# Patient Record
Sex: Male | Born: 1970
Health system: Southern US, Community
[De-identification: ages and names within clinical notes are randomized; demographics above are authoritative.]

## PROBLEM LIST (undated history)

## (undated) DIAGNOSIS — D869 Sarcoidosis, unspecified: Secondary | ICD-10-CM

## (undated) DIAGNOSIS — T7840XA Allergy, unspecified, initial encounter: Secondary | ICD-10-CM

## (undated) HISTORY — PX: VASECTOMY: SHX75

## (undated) HISTORY — DX: Allergy, unspecified, initial encounter: T78.40XA

## (undated) HISTORY — DX: Sarcoidosis, unspecified: D86.9

---

## 2003-09-13 ENCOUNTER — Encounter: Admission: RE | Admit: 2003-09-13 | Discharge: 2003-09-13 | Payer: Self-pay | Admitting: Family Medicine

## 2004-07-17 ENCOUNTER — Ambulatory Visit: Payer: Self-pay | Admitting: Family Medicine

## 2004-08-28 ENCOUNTER — Ambulatory Visit: Payer: Self-pay | Admitting: Family Medicine

## 2004-09-05 ENCOUNTER — Ambulatory Visit: Payer: Self-pay | Admitting: Family Medicine

## 2004-09-08 ENCOUNTER — Emergency Department (HOSPITAL_COMMUNITY): Admission: EM | Admit: 2004-09-08 | Discharge: 2004-09-08 | Payer: Self-pay | Admitting: Emergency Medicine

## 2004-09-24 ENCOUNTER — Ambulatory Visit: Payer: Self-pay | Admitting: Family Medicine

## 2005-05-29 ENCOUNTER — Ambulatory Visit: Payer: Self-pay | Admitting: Family Medicine

## 2005-06-07 ENCOUNTER — Ambulatory Visit: Payer: Self-pay | Admitting: Family Medicine

## 2005-06-07 ENCOUNTER — Encounter: Admission: RE | Admit: 2005-06-07 | Discharge: 2005-06-07 | Payer: Self-pay | Admitting: Family Medicine

## 2005-06-13 ENCOUNTER — Ambulatory Visit: Payer: Self-pay | Admitting: Family Medicine

## 2005-10-07 ENCOUNTER — Ambulatory Visit: Payer: Self-pay | Admitting: Family Medicine

## 2005-10-23 ENCOUNTER — Encounter: Admission: RE | Admit: 2005-10-23 | Discharge: 2005-10-23 | Payer: Self-pay | Admitting: Pediatrics

## 2005-11-06 ENCOUNTER — Ambulatory Visit: Payer: Self-pay | Admitting: Internal Medicine

## 2005-12-18 ENCOUNTER — Ambulatory Visit: Payer: Self-pay | Admitting: Internal Medicine

## 2006-02-03 ENCOUNTER — Ambulatory Visit: Payer: Self-pay | Admitting: Family Medicine

## 2006-02-18 ENCOUNTER — Ambulatory Visit: Payer: Self-pay | Admitting: Family Medicine

## 2006-05-14 ENCOUNTER — Ambulatory Visit: Payer: Self-pay | Admitting: Internal Medicine

## 2006-07-04 ENCOUNTER — Ambulatory Visit: Payer: Self-pay | Admitting: Family Medicine

## 2006-10-20 ENCOUNTER — Ambulatory Visit: Payer: Self-pay | Admitting: Family Medicine

## 2006-11-03 ENCOUNTER — Ambulatory Visit: Payer: Self-pay | Admitting: Family Medicine

## 2006-11-12 ENCOUNTER — Ambulatory Visit: Payer: Self-pay | Admitting: Family Medicine

## 2007-04-16 DIAGNOSIS — R059 Cough, unspecified: Secondary | ICD-10-CM | POA: Insufficient documentation

## 2007-04-16 DIAGNOSIS — R05 Cough: Secondary | ICD-10-CM

## 2007-04-21 ENCOUNTER — Ambulatory Visit: Payer: Self-pay | Admitting: Internal Medicine

## 2007-04-21 DIAGNOSIS — J018 Other acute sinusitis: Secondary | ICD-10-CM | POA: Insufficient documentation

## 2007-05-08 ENCOUNTER — Ambulatory Visit: Payer: Self-pay | Admitting: Family Medicine

## 2007-05-08 DIAGNOSIS — D869 Sarcoidosis, unspecified: Secondary | ICD-10-CM | POA: Insufficient documentation

## 2007-08-11 DIAGNOSIS — B370 Candidal stomatitis: Secondary | ICD-10-CM | POA: Insufficient documentation

## 2007-08-13 ENCOUNTER — Ambulatory Visit: Payer: Self-pay | Admitting: Family Medicine

## 2007-09-07 DIAGNOSIS — B9789 Other viral agents as the cause of diseases classified elsewhere: Secondary | ICD-10-CM | POA: Insufficient documentation

## 2007-09-11 ENCOUNTER — Ambulatory Visit: Payer: Self-pay | Admitting: Family Medicine

## 2008-01-13 ENCOUNTER — Ambulatory Visit: Payer: Self-pay | Admitting: Family Medicine

## 2008-01-13 DIAGNOSIS — J45909 Unspecified asthma, uncomplicated: Secondary | ICD-10-CM | POA: Insufficient documentation

## 2008-01-13 DIAGNOSIS — F17200 Nicotine dependence, unspecified, uncomplicated: Secondary | ICD-10-CM | POA: Insufficient documentation

## 2008-01-27 ENCOUNTER — Ambulatory Visit: Payer: Self-pay | Admitting: Family Medicine

## 2008-02-01 DIAGNOSIS — J309 Allergic rhinitis, unspecified: Secondary | ICD-10-CM | POA: Insufficient documentation

## 2008-02-01 DIAGNOSIS — J45909 Unspecified asthma, uncomplicated: Secondary | ICD-10-CM | POA: Insufficient documentation

## 2008-02-04 ENCOUNTER — Telehealth: Payer: Self-pay | Admitting: Family Medicine

## 2008-03-15 ENCOUNTER — Ambulatory Visit: Payer: Self-pay | Admitting: Family Medicine

## 2008-03-15 DIAGNOSIS — K2100 Gastro-esophageal reflux disease with esophagitis, without bleeding: Secondary | ICD-10-CM | POA: Insufficient documentation

## 2008-03-22 ENCOUNTER — Telehealth: Payer: Self-pay | Admitting: Family Medicine

## 2008-04-05 ENCOUNTER — Telehealth: Payer: Self-pay | Admitting: Family Medicine

## 2008-04-13 ENCOUNTER — Telehealth: Payer: Self-pay | Admitting: *Deleted

## 2008-06-02 ENCOUNTER — Ambulatory Visit: Payer: Self-pay | Admitting: Family Medicine

## 2008-06-20 ENCOUNTER — Telehealth: Payer: Self-pay | Admitting: Family Medicine

## 2008-11-04 ENCOUNTER — Telehealth: Payer: Self-pay | Admitting: Family Medicine

## 2008-11-08 ENCOUNTER — Telehealth: Payer: Self-pay | Admitting: Family Medicine

## 2008-12-11 DIAGNOSIS — M542 Cervicalgia: Secondary | ICD-10-CM | POA: Insufficient documentation

## 2008-12-16 ENCOUNTER — Ambulatory Visit: Payer: Self-pay | Admitting: Family Medicine

## 2008-12-16 DIAGNOSIS — Z8679 Personal history of other diseases of the circulatory system: Secondary | ICD-10-CM | POA: Insufficient documentation

## 2008-12-19 ENCOUNTER — Telehealth (INDEPENDENT_AMBULATORY_CARE_PROVIDER_SITE_OTHER): Payer: Self-pay | Admitting: *Deleted

## 2009-01-03 ENCOUNTER — Ambulatory Visit: Payer: Self-pay | Admitting: Pulmonary Disease

## 2009-01-03 DIAGNOSIS — G4733 Obstructive sleep apnea (adult) (pediatric): Secondary | ICD-10-CM | POA: Insufficient documentation

## 2009-01-20 ENCOUNTER — Encounter: Payer: Self-pay | Admitting: Pulmonary Disease

## 2009-01-20 ENCOUNTER — Ambulatory Visit (HOSPITAL_BASED_OUTPATIENT_CLINIC_OR_DEPARTMENT_OTHER): Admission: RE | Admit: 2009-01-20 | Discharge: 2009-01-20 | Payer: Self-pay | Admitting: Pulmonary Disease

## 2009-02-07 ENCOUNTER — Ambulatory Visit: Payer: Self-pay | Admitting: Pulmonary Disease

## 2009-02-08 ENCOUNTER — Telehealth (INDEPENDENT_AMBULATORY_CARE_PROVIDER_SITE_OTHER): Payer: Self-pay | Admitting: *Deleted

## 2009-02-10 ENCOUNTER — Ambulatory Visit: Payer: Self-pay | Admitting: Pulmonary Disease

## 2009-04-12 ENCOUNTER — Ambulatory Visit: Payer: Self-pay | Admitting: Pulmonary Disease

## 2009-04-17 ENCOUNTER — Telehealth: Payer: Self-pay | Admitting: Family Medicine

## 2009-04-17 DIAGNOSIS — R0989 Other specified symptoms and signs involving the circulatory and respiratory systems: Secondary | ICD-10-CM

## 2009-04-17 DIAGNOSIS — G4710 Hypersomnia, unspecified: Secondary | ICD-10-CM | POA: Insufficient documentation

## 2009-04-17 DIAGNOSIS — R0609 Other forms of dyspnea: Secondary | ICD-10-CM | POA: Insufficient documentation

## 2009-05-21 ENCOUNTER — Emergency Department (HOSPITAL_COMMUNITY): Admission: EM | Admit: 2009-05-21 | Discharge: 2009-05-21 | Payer: Self-pay | Admitting: Family Medicine

## 2009-05-22 ENCOUNTER — Telehealth: Payer: Self-pay | Admitting: Family Medicine

## 2009-06-08 ENCOUNTER — Ambulatory Visit: Payer: Self-pay | Admitting: Family Medicine

## 2009-06-08 DIAGNOSIS — M25539 Pain in unspecified wrist: Secondary | ICD-10-CM | POA: Insufficient documentation

## 2009-09-15 ENCOUNTER — Ambulatory Visit: Payer: Self-pay | Admitting: Family Medicine

## 2009-09-15 DIAGNOSIS — L0201 Cutaneous abscess of face: Secondary | ICD-10-CM | POA: Insufficient documentation

## 2009-09-15 DIAGNOSIS — L03211 Cellulitis of face: Secondary | ICD-10-CM

## 2009-09-16 ENCOUNTER — Encounter: Payer: Self-pay | Admitting: Family Medicine

## 2009-09-18 ENCOUNTER — Telehealth: Payer: Self-pay | Admitting: Family Medicine

## 2009-09-19 ENCOUNTER — Ambulatory Visit: Payer: Self-pay | Admitting: Family Medicine

## 2009-09-26 ENCOUNTER — Ambulatory Visit: Payer: Self-pay | Admitting: Family Medicine

## 2009-10-02 ENCOUNTER — Telehealth: Payer: Self-pay | Admitting: Family Medicine

## 2009-10-06 ENCOUNTER — Ambulatory Visit: Payer: Self-pay | Admitting: Family Medicine

## 2009-12-15 ENCOUNTER — Telehealth: Payer: Self-pay | Admitting: Family Medicine

## 2009-12-19 ENCOUNTER — Ambulatory Visit: Payer: Self-pay | Admitting: Family Medicine

## 2009-12-19 DIAGNOSIS — G43809 Other migraine, not intractable, without status migrainosus: Secondary | ICD-10-CM | POA: Insufficient documentation

## 2009-12-25 ENCOUNTER — Telehealth: Payer: Self-pay | Admitting: Family Medicine

## 2009-12-26 ENCOUNTER — Telehealth: Payer: Self-pay | Admitting: Family Medicine

## 2009-12-27 ENCOUNTER — Ambulatory Visit: Payer: Self-pay | Admitting: Cardiovascular Disease

## 2010-01-03 ENCOUNTER — Telehealth: Payer: Self-pay | Admitting: Family Medicine

## 2010-01-15 ENCOUNTER — Telehealth (INDEPENDENT_AMBULATORY_CARE_PROVIDER_SITE_OTHER): Payer: Self-pay | Admitting: *Deleted

## 2010-03-15 ENCOUNTER — Telehealth: Payer: Self-pay | Admitting: Family Medicine

## 2010-03-15 ENCOUNTER — Encounter: Payer: Self-pay | Admitting: Family Medicine

## 2010-04-18 ENCOUNTER — Telehealth: Payer: Self-pay | Admitting: Family Medicine

## 2010-08-08 ENCOUNTER — Ambulatory Visit: Payer: Self-pay | Admitting: Pulmonary Disease

## 2010-09-07 ENCOUNTER — Ambulatory Visit
Admission: RE | Admit: 2010-09-07 | Discharge: 2010-09-07 | Payer: Self-pay | Source: Home / Self Care | Attending: Family Medicine | Admitting: Family Medicine

## 2010-10-02 NOTE — Assessment & Plan Note (Signed)
Summary: in grown hair-thinks it is ? mrsa/ccm   Vital Signs:  Patient profile:   40 year old male Weight:      192 pounds Temp:     98.5 degrees F BP sitting:   120 / 72  Vitals Entered By: Lynann Beaver CMA (September 15, 2009 2:29 PM) CC: concerned about ingrown hair on face that is infected Is Patient Diabetic? No Pain Assessment Patient in pain? no        Primary Care Provider:  Tawanna Cooler  CC:  concerned about ingrown hair on face that is infected.  History of Present Illness: Kevin Mcgee is a 40 year old, married male, nonsmoker, who comes in today for a boil under his chin.  A couple days ago.  He had an ingrown hair that turned into a boil.  Two of his family members have had MRSA in the past 3 months.  Most recently his wife one month ago.  Current Medications (verified): 1)  Mvi 2)  Flonase 50 Mcg/act  Susp (Fluticasone Propionate) .... One Shot To Each Nostril Two Times A Day 3)  Kls Aller-Tec 10 Mg Tabs (Cetirizine Hcl) .... Once Daily  Allergies (verified): No Known Drug Allergies  Past History:  Past medical, surgical, family and social histories (including risk factors) reviewed, and no changes noted (except as noted below).  Past Medical History: Reviewed history from 01/27/2008 and no changes required. sarcoidosis Allergic rhinitis Asthma  Family History: Reviewed history from 08/13/2007 and no changes required. Family History Hypertension  Social History: Reviewed history from 12/16/2008 and no changes required.  Occupation: Airline pilot Married Former Smoker Alcohol use-no Drug use-no Regular exercise-yes Current Smoker  Review of Systems      See HPI  Physical Exam  General:  Well-developed,well-nourished,in no acute distress; alert,appropriate and cooperative throughout examination Skin:  boil under her chin.  No obvious pus culture taken anyone.  He   Problems:  Medical Problems Added: 1)  Dx of Cellulitis, Face   (ICD-682.0)  Impression & Recommendations:  Problem # 1:  CELLULITIS, FACE (ICD-682.0) Assessment New  His updated medication list for this problem includes:    Keflex 500 Mg Caps (Cephalexin) .Marland Kitchen... 2 by mouth two times a day  Orders: Prescription Created Electronically [phone]) T-Culture, Wound ([id number])  Complete Medication List: 1)  Mvi  2)  Flonase 50 Mcg/act Susp (Fluticasone propionate) .... One shot to each nostril two times a day 3)  Kls Aller-tec 10 Mg Tabs (Cetirizine hcl) .... Once daily 4)  Keflex 500 Mg Caps (Cephalexin) .... 2 by mouth two times a day  Patient Instructions: 1)  apply warm soaks 15 minutes 3 or 4 times daily.  Begin Keflex two tabs b.i.d.Marland Kitchen  I will call you next week when I get your culture report back Prescriptions: KEFLEX 500 MG CAPS (CEPHALEXIN) 2 by mouth two times a day  #50 x 1   Entered and Authorized by:   [name] MD   Signed by:   [name] MD on 09/15/2009   Method used:   Electronically to        [facility] (retail)       [address]       [place], [place]  [address]       Ph: [phone]       Fax: [phone]   RxID:   [id number]

## 2010-10-02 NOTE — Progress Notes (Signed)
Summary: antibiotic  Phone Note Call from Patient   Caller: Patient Call For: [name] MD Summary of Call: Pt is having a sinus infection, headache, and congestion.  Would like an antibiotic as he cannot get away from the office until next week. [phone]  [facility]) Initial call taken by: Lynann Beaver CMA,  December 15, 2009 1:19 PM  Follow-up for Phone Call        please call we cannot call in any medication. Follow-up by: [name] MD,  December 15, 2009 1:43 PM  Additional Follow-up for Phone Call Additional follow up Details #1::        left message for patient to return our call Additional Follow-up by: Kern Reap CMA Duncan Dull),  December 15, 2009 2:13 PM    Additional Follow-up for Phone Call Additional follow up Details #2::    [phone] patient is aware Follow-up by: Kern Reap CMA Duncan Dull),  December 15, 2009 4:21 PM

## 2010-10-02 NOTE — Assessment & Plan Note (Signed)
Summary: 1 wk rov/njr   Vital Signs:  Patient profile:   40 year old male Weight:      193 pounds Temp:     193 degrees F BP sitting:   108 / 80  (left arm) Cuff size:   regular  Vitals Entered By: Kern Reap CMA Duncan Dull) (September 26, 2009 1:58 PM)  Reason for Visit follow up office visit  Primary Care Provider:  Tawanna Cooler   History of Present Illness: Kevin Mcgee is a 40 year old male comes back today for follow-up of MRSA.  The lesions have healed however, the one under his chin.  His left a lump.    Allergies: No Known Drug Allergies  Review of Systems      See HPI  Physical Exam  General:  Well-developed,well-nourished,in no acute distress; alert,appropriate and cooperative throughout examination Skin:  there is a marble-sized lesion in the under the angle of the right mandible where the previous abscess was scarred down hard, firm, not draining   Impression & Recommendations:  Problem # 1:  CELLULITIS, FACE (ICD-682.0) Assessment Improved  His updated medication list for this problem includes:    Keflex 500 Mg Caps (Cephalexin) .Marland Kitchen... 2 by mouth two times a day    Doxycycline Hyclate 100 Mg Caps (Doxycycline hyclate) .Marland Kitchen... Take 1 tablet by mouth two times a day    Septra Ds 800-160 Mg Tabs (Sulfamethoxazole-trimethoprim) .Marland Kitchen... Take 1 tablet by mouth two times a day  Complete Medication List: 1)  Mvi  2)  Flonase 50 Mcg/act Susp (Fluticasone propionate) .... One shot to each nostril two times a day 3)  Kls Aller-tec 10 Mg Tabs (Cetirizine hcl) .... Once daily 4)  Keflex 500 Mg Caps (Cephalexin) .... 2 by mouth two times a day 5)  Doxycycline Hyclate 100 Mg Caps (Doxycycline hyclate) .... Take 1 tablet by mouth two times a day 6)  Septra Ds 800-160 Mg Tabs (Sulfamethoxazole-trimethoprim) .... Take 1 tablet by mouth two times a day  Patient Instructions: 1)  take the doxycycline and Septra, one of each twice a day for another week for a total two-week course of  therapy. 2)  If you notice any recurrence began the medication immediately and call us. 3)  Also, if the mass is bothersome, but is now that we will get his to see a plastic surgeon to have that excised Prescriptions: FLONASE 50 MCG/ACT  SUSP (FLUTICASONE PROPIONATE) one shot to each nostril two times a day  #1 x 3   Entered by:   Kern Reap CMA (AAMA)   Authorized by:   [name] MD   Signed by:   Kern Reap CMA (AAMA) on 09/26/2009   Method used:   Faxed to ...       [facility] (retail)       [address]       [place], [place]  [address]       Ph: [phone]       Fax: [phone]   RxID:   [id number]

## 2010-10-02 NOTE — Progress Notes (Signed)
Summary: cpap  Phone Note Call from Patient   Caller: Patient Call For: clance Summary of Call: pt has questions re: purchase of cpap. [phone] Initial call taken by: Tivis Ringer, CNA,  Jan 15, 2010 3:10 PM  Follow-up for Phone Call        pt wanted advise on what kind of cpap to purchase on line i advised him if he had not seen his dr in 66yr he needs to see the dr and let the dr help him decide what to do  Follow-up by: Oneita Jolly,  Jan 15, 2010 4:43 PM

## 2010-10-02 NOTE — Progress Notes (Signed)
  Phone Note Outgoing Call   Summary of Call: culture report grew out MRSA......... stop the Keflex begin doxycycline and Septra, one of each b.i.d. follow-up Thursday morning 815 Initial call taken by: [name] MD,  September 18, 2009 1:40 PM

## 2010-10-02 NOTE — Progress Notes (Signed)
Summary: records to [facility]  Phone Note Call from Patient Call back at Home Phone [phone]   Caller: Patient Summary of Call: Headache clinic has not received records & says I cannot make appointment there until they receive records.  Dr. Tawanna Cooler told me to go there.   Initial call taken by: [name], RN,  Jan 03, 2010 2:24 PM  Follow-up for Phone Call        please send records to headache clinic Follow-up by: [name] MD,  Jan 04, 2010 7:23 AM  Additional Follow-up for Phone Call Additional follow up Details #1::        faxed Additional Follow-up by: Kern Reap CMA Duncan Dull),  Jan 04, 2010 10:46 AM

## 2010-10-02 NOTE — Assessment & Plan Note (Signed)
Summary: lesion on buttocks/dm   Vital Signs:  Patient profile:   40 year old male Weight:      195 pounds Temp:     98.6 degrees F oral BP sitting:   110 / 80  (left arm) Cuff size:   regular  Vitals Entered By: Kern Reap CMA Duncan Dull) (September 19, 2009 4:29 PM)  Reason for Visit lession on buttock  Primary Care Provider:  Tawanna Cooler   History of Present Illness: Kevin Mcgee is a 40 year old male, who comes back today for follow-up of MRSA.  He was seen earlier in the week with a lesion under his chin.  Culture was positive.  He was started on doxy b.i.d. and Septra b.i.d.  He now has a new bump around his rectum.  The lesion on his chin is healing and draining  Allergies: No Known Drug Allergies  Past History:  Past medical, surgical, family and social histories (including risk factors) reviewed for relevance to current acute and chronic problems.  Past Medical History: Reviewed history from 01/27/2008 and no changes required. sarcoidosis Allergic rhinitis Asthma  Family History: Reviewed history from 08/13/2007 and no changes required. Family History Hypertension  Social History: Reviewed history from 12/16/2008 and no changes required.  Occupation: Airline pilot Married Former Smoker Alcohol use-no Drug use-no Regular exercise-yes Current Smoker  Review of Systems      See HPI  Physical Exam  General:  Well-developed,well-nourished,in no acute distress; alert,appropriate and cooperative throughout examination Skin:  the lesion under his chin is straining, and healing.  There is a BB-sized bump around the rectum at the 12 o'clock position.   Impression & Recommendations:  Problem # 1:  CELLULITIS, FACE (ICD-682.0) Assessment Improved  His updated medication list for this problem includes:    Keflex 500 Mg Caps (Cephalexin) .Marland Kitchen... 2 by mouth two times a day    Doxycycline Hyclate 100 Mg Caps (Doxycycline hyclate) .Marland Kitchen... Take 1 tablet by mouth two times a day  Septra Ds 800-160 Mg Tabs (Sulfamethoxazole-trimethoprim) .Marland Kitchen... Take 1 tablet by mouth two times a day  Orders: Prescription Created Electronically [phone])  Complete Medication List: 1)  Mvi  2)  Flonase 50 Mcg/act Susp (Fluticasone propionate) .... One shot to each nostril two times a day 3)  Kls Aller-tec 10 Mg Tabs (Cetirizine hcl) .... Once daily 4)  Keflex 500 Mg Caps (Cephalexin) .... 2 by mouth two times a day 5)  Doxycycline Hyclate 100 Mg Caps (Doxycycline hyclate) .... Take 1 tablet by mouth two times a day 6)  Septra Ds 800-160 Mg Tabs (Sulfamethoxazole-trimethoprim) .... Take 1 tablet by mouth two times a day  Patient Instructions: 1)  continue the medication, and soaking.  Return in one week for follow Prescriptions: FLONASE 50 MCG/ACT  SUSP (FLUTICASONE PROPIONATE) one shot to each nostril two times a day  #1 x 3   Entered by:   Kern Reap CMA (AAMA)   Authorized by:   [name] MD   Signed by:   Kern Reap CMA (AAMA) on 09/19/2009   Method used:   Electronically to        [facility] (retail)       [address]       [place], [place]  [address]       Ph: [phone]       Fax: [phone]   RxID:   [id number]

## 2010-10-02 NOTE — Assessment & Plan Note (Signed)
Summary: pt concerned about pneumonia/dm   Vital Signs:  Patient profile:   40 year old male Weight:      197 pounds Temp:     98.6 degrees F BP sitting:   110 / 80  (left arm) Cuff size:   regular  Vitals Entered By: Kern Reap CMA Duncan Dull) (October 06, 2009 2:30 PM)  Reason for Visit headache, cough, head congestion  Primary Care Provider:  Tawanna Cooler   History of Present Illness: Kevin Mcgee is a 40 year old, married male, nonsmoker, who comes in today for evaluation of two issues.  MRSA is going to the family again.  His 40-year-old son now has it for the second time.  [name] had two lesions that we start him on doxycycline and Septra b.i.d. two weeks ago.  The lesions have mostly resolved.  He did develop some nausea.  Therefore, we stopped the medicine for 48 hours restarted at one minute each daily.  The lesion in his neck is markedly diminished.  In the past 3 or 4 days had head congestion, runny nose, nonproductive cough.  No fever.    Allergies: No Known Drug Allergies  Past History:  Past medical, surgical, family and social histories (including risk factors) reviewed, and no changes noted (except as noted below).  Past Medical History: Reviewed history from 01/27/2008 and no changes required. sarcoidosis Allergic rhinitis Asthma  Family History: Reviewed history from 08/13/2007 and no changes required. Family History Hypertension  Social History: Reviewed history from 12/16/2008 and no changes required.  Occupation: Airline pilot Married Former Smoker Alcohol use-no Drug use-no Regular exercise-yes Current Smoker  Review of Systems      See HPI  Physical Exam  General:  Well-developed,well-nourished,in no acute distress; alert,appropriate and cooperative throughout examination Head:  Normocephalic and atraumatic without obvious abnormalities. No apparent alopecia or balding. Eyes:  No corneal or conjunctival inflammation noted. EOMI. Perrla. Funduscopic exam  benign, without hemorrhages, exudates or papilledema. Vision grossly normal. Ears:  External ear exam shows no significant lesions or deformities.  Otoscopic examination reveals clear canals, tympanic membranes are intact bilaterally without bulging, retraction, inflammation or discharge. Hearing is grossly normal bilaterally. Nose:  External nasal examination shows no deformity or inflammation. Nasal mucosa are pink and moist without lesions or exudates. Mouth:  Oral mucosa and oropharynx without lesions or exudates.  Teeth in good repair. Skin:  the submandibular lesion is 75% smaller than it was a week ago   Impression & Recommendations:  Problem # 1:  CELLULITIS, FACE (ICD-682.0) Assessment Improved  His updated medication list for this problem includes:    Keflex 500 Mg Caps (Cephalexin) .Marland Kitchen... 2 by mouth two times a day    Doxycycline Hyclate 100 Mg Caps (Doxycycline hyclate) .Marland Kitchen... Take 1 tablet by mouth two times a day    Septra Ds 800-160 Mg Tabs (Sulfamethoxazole-trimethoprim) .Marland Kitchen... Take 1 tablet by mouth two times a day  Problem # 2:  VIRAL INFECTION (ICD-079.99) Assessment: Deteriorated  His updated medication list for this problem includes:    Hydromet 5-1.5 Mg/53ml Syrp (Hydrocodone-homatropine) .Marland Kitchen... 1 or 2 tsps at bedtime as needed  Complete Medication List: 1)  Mvi  2)  Flonase 50 Mcg/act Susp (Fluticasone propionate) .... One shot to each nostril two times a day 3)  Kls Aller-tec 10 Mg Tabs (Cetirizine hcl) .... Once daily 4)  Keflex 500 Mg Caps (Cephalexin) .... 2 by mouth two times a day 5)  Doxycycline Hyclate 100 Mg Caps (Doxycycline hyclate) .... Take 1 tablet by  mouth two times a day 6)  Septra Ds 800-160 Mg Tabs (Sulfamethoxazole-trimethoprim) .... Take 1 tablet by mouth two times a day 7)  Phisohex 3 % Liqd (Hexachlorophene) .... Scrub 3 x week 8)  Hydromet 5-1.5 Mg/10ml Syrp (Hydrocodone-homatropine) .Marland Kitchen.. 1 or 2 tsps at bedtime as needed  Patient  Instructions: 1)  one or 2 teaspoons of Hydromet at bedtime as needed for cough and cold. 2)  Take one doxycycline and Septra daily for two more weeks. 3)  We might consider scrubbing the whole family with pHisoHex 3 times a week Prescriptions: HYDROMET 5-1.5 MG/5ML SYRP (HYDROCODONE-HOMATROPINE) 1 or 2 tsps at bedtime as needed  #8oz x 1   Entered and Authorized by:   [name] MD   Signed by:   [name] MD on 10/06/2009   Method used:   Print then Give to Patient   RxID:   [id number] PHISOHEX 3 % LIQD (HEXACHLOROPHENE) Scrub 3 x week  #1 pint x 2   Entered and Authorized by:   [name] MD   Signed by:   [name] MD on 10/06/2009   Method used:   Print then Give to Patient   RxID:   [id number]

## 2010-10-02 NOTE — Assessment & Plan Note (Signed)
Summary: severe headaches/sinus issues/cjr   Vital Signs:  Patient profile:   40 year old male Height:      74.5 inches Weight:      200 pounds BMI:     25.43 Temp:     97.8 degrees F oral BP sitting:   120 / 80  (left arm) Cuff size:   regular  Vitals Entered By: Kern Reap CMA Duncan Dull) (December 19, 2009 11:36 AM) CC: headache Is Patient Diabetic? No Pain Assessment Patient in pain? yes     Location: head Intensity: 3 Type: dull   Primary Care Provider:  Tawanna Cooler  CC:  headache.  History of Present Illness: Kevin Mcgee is a 40 year old male, who comes in today for evaluation of chronic daily headaches for one month.  He noticed a month ago he began having bifrontal headaches.  They occur in the morning to last all day long.  They don't keep him awake at night.  He has no neurologic symptoms specifically, no vision change, hearing change, numbness, weakness, et Karie Soda.  He has had a history of migraines in the past.  He also has allergic rhinitis.  Allergies: No Known Drug Allergies  Past History:  Past medical, surgical, family and social histories (including risk factors) reviewed for relevance to current acute and chronic problems.  Past Medical History: Reviewed history from 01/27/2008 and no changes required. sarcoidosis Allergic rhinitis Asthma  Family History: Reviewed history from 08/13/2007 and no changes required. Family History Hypertension  Social History: Reviewed history from 12/16/2008 and no changes required.  Occupation: Airline pilot Married Former Smoker Alcohol use-no Drug use-no Regular exercise-yes Current Smoker  Review of Systems      See HPI  Physical Exam  General:  Well-developed,well-nourished,in no acute distress; alert,appropriate and cooperative throughout examination Head:  Normocephalic and atraumatic without obvious abnormalities. No apparent alopecia or balding. Eyes:  No corneal or conjunctival inflammation noted. EOMI. Perrla.  Funduscopic exam benign, without hemorrhages, exudates or papilledema. Vision grossly normal. Ears:  bilateral cerumen impactions removed by irrigation. Nose:  External nasal examination shows no deformity or inflammation. Nasal mucosa are pink and moist without lesions or exudates. Mouth:  Oral mucosa and oropharynx without lesions or exudates.  Teeth in good repair. Neurologic:  No cranial nerve deficits noted. Station and gait are normal. Plantar reflexes are down-going bilaterally. DTRs are symmetrical throughout. Sensory, motor and coordinative functions appear intact.   Problems:  Medical Problems Added: 1)  Dx of Cluster Headache  (ICD-346.20)  Impression & Recommendations:  Problem # 1:  CLUSTER HEADACHE (ICD-346.20) Assessment New  Orders: Prescription Created Electronically [phone])  Complete Medication List: 1)  Mvi  2)  Flonase 50 Mcg/act Susp (Fluticasone propionate) .... One shot to each nostril two times a day 3)  Kls Aller-tec 10 Mg Tabs (Cetirizine hcl) .... Once daily 4)  Doxycycline Hyclate 100 Mg Caps (Doxycycline hyclate) .... Take 1 tablet by mouth two times a day 5)  Prednisone 20 Mg Tabs (Prednisone) .... Uad  Patient Instructions: 1)  begin prednisone, take two tabs x 3 days, one x 3 days, a half x 3 days, then half a tablet Monday, Wednesday, Friday, for a two week taper.  Return p.r.n. Prescriptions: PREDNISONE 20 MG TABS (PREDNISONE) UAD  #40 x 1   Entered and Authorized by:   [name] MD   Signed by:   [name] MD on 12/19/2009   Method used:   Electronically to        [facility] [address] (retail)       [address]       [place], [place]  [address]       Ph: [phone]       Fax: [phone]   RxID:   [id number]

## 2010-10-02 NOTE — Letter (Signed)
Summary: HEADACHE REFERRAL SHEET  HEADACHE REFERRAL SHEET   Imported ByLysle Rubens 03/15/2010 16:36:51  _____________________________________________________________________  External Attachment:    Type:   Image     Comment:   External Document

## 2010-10-02 NOTE — Progress Notes (Signed)
Summary: MRSA?  Phone Note Call from Patient   Caller: Patient Call For: [name] MD Summary of Call: Pt feels that he has a recurrence of MRSA under chin.  He has Bactrim and Doxycycline at home, and is asking if he should start treatment. [phone] Initial call taken by: Lynann Beaver CMA,  April 18, 2010 9:14 AM  Follow-up for Phone Call        he can take the doxycycline twice a day, and the Septra twice a day or before starting medication to confirm diagnosis.  He can come here today to get a culture Follow-up by: [name] MD,  April 18, 2010 9:24 AM  Additional Follow-up for Phone Call Additional follow up Details #1::        patient will begin taking medication today Additional Follow-up by: Kern Reap CMA Duncan Dull),  April 18, 2010 11:30 AM

## 2010-10-02 NOTE — Progress Notes (Signed)
Summary: headace no better  Phone Note Call from Patient   Caller: Patient Call For: [name] MD Summary of Call: [phone] [facility]) Still having cluster headache and is going to taper the Prednisone.  Wants to know what to do next? Initial call taken by: Lynann Beaver CMA,  December 25, 2009 10:05 AM  Follow-up for Phone Call        set him up for a limited CT scan of the sinuses.  No contrast continue current dose of prednisone.  We will call after x-ray report Follow-up by: [name] MD,  December 25, 2009 10:11 AM  Additional Follow-up for Phone Call Additional follow up Details #1::        Orders sent to Terri. Additional Follow-up by: Lynann Beaver CMA,  December 25, 2009 10:37 AM

## 2010-10-02 NOTE — Progress Notes (Signed)
Summary: PT DECLINE OV WITH [facility]  Phone Note Call from Patient Call back at Home Phone [phone]   Caller: Patient Call For: [name] MD Summary of Call: Referral to [facility] center was iniated on 01-04-2010. Pt decline ov he will call headache wellness center if needed per referral coordinator. Initial call taken by: Heron Sabins,  March 15, 2010 4:34 PM

## 2010-10-02 NOTE — Progress Notes (Signed)
Summary: nausea & lingering headache  Phone Note Call from Patient Call back at Home Phone [phone]   Caller: vm Call For: Kevin Mcgee Summary of Call: Taking antibiotics given ov.   Feeling nausea & lingering headache since Thur.  Taking ASA & Excedrin Migraine for it.  Eating food before antibiotics but still feel nausea.  Side effect or concern?   Initial call taken by: [name], RN,  October 02, 2009 3:09 PM  Follow-up for Phone Call        these are common side effects from the medication.  Hold the medicine for 48 hours and then restart it by taking one of each daily Follow-up by: [name] MD,  October 02, 2009 5:58 PM  Additional Follow-up for Phone Call Additional follow up Details #1::        Will hold off on meds for 48 hours, then take one of each for another week, and if any questions about how he's doing, he'll call back.  Last week he was told to take the meds for another week, so when he resumes meds, he'll take them for another week. Additional Follow-up by: [name], RN,  October 03, 2009 9:21 AM

## 2010-10-02 NOTE — Assessment & Plan Note (Signed)
Summary: rov for UARS   Visit Type:  Follow-up Primary Provider/Referring Provider:  Tawanna Cooler  CC:  follow up. pt is no longer on cpap machine. pt states he was on a loner and he returned it. pt c/o not being able to sleep and feels tired all day. Marland Kitchen  History of Present Illness: the pt comes in today for f/u of his sleep issues.  He has not been seen in a year, and has a h/o UARS with loud snoring and daytime hypersomnia.  He was tried on cpap initially which did work well, but he stopped using.  He could not justify the inconvenience with the improvement in his QOL.  He comes in today where he continues to snore per his wife, and has nonrestorative sleep with daytime sleepiness.  His weight is actually down 2 pounds from last year.  Current Medications (verified): 1)  Mvi .... Once Daily 2)  Flonase 50 Mcg/act  Susp (Fluticasone Propionate) .... One Shot To Each Nostril Two Times A Day 3)  Kls Aller-Tec 10 Mg Tabs (Cetirizine Hcl) .... Once Daily 4)  Omeprazole 20 Mg Cpdr (Omeprazole) .Marland Kitchen.. 1 Every Other Day  Allergies (verified): No Known Drug Allergies  Review of Systems       The patient complains of shortness of breath with activity, productive cough, non-productive cough, chest pain, acid heartburn, headaches, nasal congestion/difficulty breathing through nose, and anxiety.  The patient denies shortness of breath at rest, coughing up blood, irregular heartbeats, indigestion, loss of appetite, weight change, abdominal pain, difficulty swallowing, sore throat, tooth/dental problems, sneezing, itching, ear ache, depression, hand/feet swelling, joint stiffness or pain, rash, change in color of mucus, and fever.    Vital Signs:  Patient profile:   40 year old male Height:      74.5 inches Weight:      203 pounds BMI:     25.81 O2 Sat:      98 % on Room air Temp:     98.4 degrees F oral Pulse rate:   100 / minute BP sitting:   128 / 84  (left arm) Cuff size:   regular  Vitals Entered  By: Carver Fila (August 08, 2010 10:08 AM)  O2 Flow:  Room air CC: follow up. pt is no longer on cpap machine. pt states he was on a loner and he returned it. pt c/o not being able to sleep, feels tired all day.  Comments meds and allergies updated Phone number updated Mindy Edward Jolly  August 08, 2010 10:09 AM    Physical Exam  General:  wd male in nad Nose:  turbinate hypertrophy, mildly deviated septum to left with narrowing but patent Mouth:  moderate elongation of soft palate, normal uvula, no tonsillar hypertrophy Extremities:  no edema or cyanosis Neurologic:  alert and oriented, moves all 4.   Impression & Recommendations:  Problem # 1:  HYPERSOMNIA (ICD-780.54) the pt has snoring and hypersomnia that is c/w the upper airway resistant syndrome.  The pt has been tried on cpap, but did not fit with his QOL.  He has continued to be symptomatic, and both his and his wife's sleep is being affected.  I have discussed the other options with him, including upper airway surgery and dental appliance.  I think the former would be best to start with, and he agrees.  Will refer to dental medicine for evaluation.  Medications Added to Medication List This Visit: 1)  Mvi  .... Once daily 2)  Omeprazole 20 Mg Cpdr (Omeprazole) .Marland Kitchen.. 1 every other day  Other Orders: Est. Patient Level III (16109) Dental Referral (Dentist)  Patient Instructions: 1)  will refer to dentistry for consideration of an oral appliance.

## 2010-10-02 NOTE — Progress Notes (Signed)
Summary: ct question  Phone Note Call from Patient   Summary of Call: patient is calling because he has spoken to someone at his insurance company.  now he would like to know if he can also have the ct to include his sinuses as well as his head. Initial call taken by: Kern Reap CMA Duncan Dull),  December 26, 2009 9:29 AM  Follow-up for Phone Call        ok Follow-up by: [name] MD,  December 26, 2009 9:33 AM  Additional Follow-up for Phone Call Additional follow up Details #1::        order updated Additional Follow-up by: Kern Reap CMA Duncan Dull),  December 26, 2009 11:12 AM

## 2010-10-02 NOTE — Progress Notes (Signed)
Summary: another call requested Dr. Karie Schwalbe  Phone Note Call from Patient Call back at [facility] Phone [phone]   Summary of Call: Re my MRSA:  Any precautions to take for family, wife who had MRSA, One child had MRSA, 2 have not had to prevent further MRSA.   Does it jump back & forth, such as holding children close to me now? Initial call taken by: [name], RN,  September 18, 2009 1:49 PM  Follow-up for Phone Call        meticulous handwashing, that's all you can do Follow-up by: [name] MD,  September 18, 2009 5:59 PM  Additional Follow-up for Phone Call Additional follow up Details #1::        Notified pt. and he needs appt today for lesion on buttocks. Additional Follow-up by: Lynann Beaver CMA,  September 19, 2009 9:08 AM

## 2010-10-04 NOTE — Assessment & Plan Note (Signed)
Summary: pt went to urgent care/?sinus inf 115pm/njr   Vital Signs:  Patient profile:   40 year old male Weight:      196 pounds Temp:     98.2 degrees F oral BP sitting:   118 / 80  (left arm) Cuff size:   regular  Vitals Entered By: Kern Reap CMA Duncan Dull) (September 07, 2010 1:26 PM) CC: follow-up visit from urgent care   Primary Care Provider:  Tawanna Cooler  CC:  follow-up visit from urgent care.  History of Present Illness: Kevin Mcgee is a 40 year old male, who comes in today for follow-up of allergic rhinitis and asthma.  He had a viral syndrome that triggered his allergic rhinitis and asthma.  Prior to the [employer].  He went to an urgent care and was given a Z-Pak, which did not really help.  He did restart the prednisone that I given him in the past and this is helped a lot.  The cough is diminished.  He stop the cough syrup, but he still has some chest congestion a lot of drainage and does some discolored sputum  Allergies: No Known Drug Allergies  Past History:  Past medical, surgical, family and social histories (including risk factors) reviewed for relevance to current acute and chronic problems.  Past Medical History: Reviewed history from 01/27/2008 and no changes required. sarcoidosis Allergic rhinitis Asthma  Family History: Reviewed history from 08/13/2007 and no changes required. Family History Hypertension  Social History: Reviewed history from 12/16/2008 and no changes required.  Occupation: Airline pilot Married Former Smoker Alcohol use-no Drug use-no Regular exercise-yes Current Smoker  Review of Systems      See HPI  Physical Exam  General:  Well-developed,well-nourished,in no acute distress; alert,appropriate and cooperative throughout examination Head:  Normocephalic and atraumatic without obvious abnormalities. No apparent alopecia or balding. Eyes:  No corneal or conjunctival inflammation noted. EOMI. Perrla. Funduscopic exam benign,  without hemorrhages, exudates or papilledema. Vision grossly normal. Ears:  External ear exam shows no significant lesions or deformities.  Otoscopic examination reveals clear canals, tympanic membranes are intact bilaterally without bulging, retraction, inflammation or discharge. Hearing is grossly normal bilaterally. Nose:  External nasal examination shows no deformity or inflammation. Nasal mucosa are pink and moist without lesions or exudates. Mouth:  Oral mucosa and oropharynx without lesions or exudates.  Teeth in good repair. Neck:  No deformities, masses, or tenderness noted. Chest Wall:  No deformities, masses, tenderness or gynecomastia noted. Lungs:  symmetrical breath sounds delayed expiratory wheezing bilaterally   Impression & Recommendations:  Problem # 1:  ASTHMA (ICD-493.90) Assessment Deteriorated  Complete Medication List: 1)  Mvi  .... Once daily 2)  Flonase 50 Mcg/act Susp (Fluticasone propionate) .... One shot to each nostril two times a day 3)  Kls Aller-tec 10 Mg Tabs (Cetirizine hcl) .... Once daily 4)  Omeprazole 20 Mg Cpdr (Omeprazole) .Marland Kitchen.. 1 every other day  Patient Instructions: 1)  take to prednisone tablets x 3 days, one x 3 days, a half x 3 days, then half a tablet Monday, Wednesday, Friday, for two week taper.  Return p.r.n.   Orders Added: 1)  Est. Patient Level IV [04540]

## 2010-10-13 ENCOUNTER — Other Ambulatory Visit: Payer: Self-pay | Admitting: Family Medicine

## 2010-10-15 ENCOUNTER — Telehealth: Payer: Self-pay | Admitting: Family Medicine

## 2010-10-15 NOTE — Telephone Encounter (Signed)
Pt called to check on status of refill of generic Flonase. Pt said req was sent in on Wed last week. Pls call in to [facility] on [address]

## 2010-10-16 MED ORDER — FLUTICASONE PROPIONATE 50 MCG/ACT NA SUSP: 2.0000 | Freq: Every day | NASAL | Status: DC

## 2010-10-29 ENCOUNTER — Encounter: Payer: Self-pay | Admitting: Family Medicine

## 2010-10-29 ENCOUNTER — Ambulatory Visit (INDEPENDENT_AMBULATORY_CARE_PROVIDER_SITE_OTHER): Payer: PRIVATE HEALTH INSURANCE | Admitting: Family Medicine

## 2010-10-29 VITALS — BP 120/80 | Temp 98.3°F | Ht 74.5 in | Wt 196.0 lb

## 2010-10-29 DIAGNOSIS — M79642 Pain in left hand: Secondary | ICD-10-CM

## 2010-10-29 DIAGNOSIS — M79609 Pain in unspecified limb: Secondary | ICD-10-CM

## 2010-10-29 NOTE — Progress Notes (Signed)
  Subjective:    Patient ID: Kevin Mcgee, male    DOB: [birth year], 40 y.o.   MRN: [id number]  HPI Kevin Mcgee Is a 40 year old, married male, nonsmoker, who comes in today for evaluation of pain in his left wrist for two months.  He said his pain in his left wrist for about two months.  He points to the base of his thumb as a source of his pain.  He is left-handed.  No history of trauma.   Review of Systems Negative   Objective:   Physical Exam Well-developed well-nourished, and in no acute distress.  Examination of both hands appears normal.  Muscle strength, sensation, skin all normal.  There is tenderness in the left thumb at the base       Assessment & Plan:  Pain left thumb.  Plan elevation, ice, splint.  Motrin, 600 b.i.d. Ortho  consult p.r.n.

## 2010-10-29 NOTE — Patient Instructions (Signed)
Motrin 600 mg twice daily with food.  A short arm splint at bedtime.  Call Dr. Laroy Apple if the symptoms persist

## 2011-01-15 NOTE — Procedures (Signed)
NAME:  Kevin Mcgee, Kevin Mcgee NO.:  [id number]   MEDICAL RECORD NO.:  [id number]          PATIENT TYPE:  OUT   LOCATION:  SLEEP CENTER                 FACILITY:  [facility]   PHYSICIAN:  Barbaraann Share, MD,FCCPDATE OF BIRTH:  10-05-70   DATE OF STUDY:  01/20/2009                            NOCTURNAL POLYSOMNOGRAM   REFERRING PHYSICIAN:  Barbaraann Share, MD,FCCP   REFERRING PHYSICIAN:  Barbaraann Share, MD, FCCP   INDICATION FOR THE STUDY:  Hypersomnia with sleep apnea.   EPWORTH SCORE:  Epworth score is 16.   SLEEP ARCHITECTURE:  The patient had a total sleep time of 388 minutes  with no slow-wave sleep and only 56 minutes of REM.  Sleep onset latency  was normal at 0.5 minutes, and REM onset was normal at 78 minutes.  Sleep efficiency was excellent at 96%.   RESPIRATORY DATA:  The patient was found to have no obstructive apneas  and only 1 obstructive hypopnea, giving him an apnea-hypopnea index of  0.2 events per hour.  There was loud snoring noted throughout.  The  patient was also noted to have very fragmented REM when it did occur,  and also 128 spontaneous arousals.  Close examination of his REM showed  a frequent decrease in air flow, but these did not meet criteria for  hypopneas.   OXYGEN DATA:  There was O2 desaturation as low as 87% transiently.   CARDIAC DATA:  No clinically significant arrhythmias were seen.   MOVEMENT/PARASOMNIA:  No significant leg jerks or abnormal behavior  seen.   IMPRESSION/RECOMMENDATIONS:  Small numbers of obstructive events, which  do not meet the apnea-hypopnea index criteria for the obstructive sleep  apnea syndrome.  However, the patient had extremely fragmented REM with  frequent spontaneous arousals.  Closer examination of REM did show  decrease in air flow on a frequent  basis, but did not meet strict criteria for hypopneas.  His NPSG  findings and clinical history are most consistent with the upper airway  resistant  syndrome.  Clinical correlation is suggested.      Barbaraann Share, MD,FCCP  Diplomate, American Board of Sleep  Medicine  Electronically Signed     [facility]/MEDQ  D:  02/07/2009 15:14:48  T:  02/08/2009 81:19:14  Job:  [id number]

## 2011-05-02 ENCOUNTER — Ambulatory Visit (INDEPENDENT_AMBULATORY_CARE_PROVIDER_SITE_OTHER): Payer: PRIVATE HEALTH INSURANCE | Admitting: Family Medicine

## 2011-05-02 ENCOUNTER — Encounter: Payer: Self-pay | Admitting: Family Medicine

## 2011-05-02 DIAGNOSIS — J45909 Unspecified asthma, uncomplicated: Secondary | ICD-10-CM

## 2011-05-02 DIAGNOSIS — J309 Allergic rhinitis, unspecified: Secondary | ICD-10-CM

## 2011-05-02 MED ORDER — FLUTICASONE PROPIONATE 50 MCG/ACT NA SUSP: 2.0000 | Freq: Every day | NASAL | Status: DC

## 2011-05-02 MED ORDER — PREDNISONE 20 MG PO TABS: ORAL_TABLET | ORAL | Status: DC

## 2011-05-02 NOTE — Progress Notes (Signed)
  Subjective:    Patient ID: Kevin Mcgee, male    DOB: 1970/11/26, 40 y.o.   MRN: [id number]  HPI Kevin Mcgee is a 40 year old male, married, who comes in today for a flare of his allergic rhinitis.  For the past 6, days he's had a congestion, postnasal drip for the past 4, days.  He's been coughing and wheezing   Review of Systems    General and pulmonary review of systems otherwise negative Objective:   Physical Exam Lomotil and nourished, male no acute distress.  Examination of the HEENT were negative, except for 3+ nasal edema.  Septum is in the midline.  Neck was supple.  No adenopathy.  Lungs were clear except for expiratory wheezing       Assessment & Plan:  Allergic rhinitis and asthma.  Plan prednisone burst and taper

## 2011-05-02 NOTE — Patient Instructions (Signed)
Take the prednisone as directed on the bottle.  Hold the Zyrtec and the steroid nasal spray, why you're on the oral prednisone

## 2011-07-11 ENCOUNTER — Encounter: Payer: Self-pay | Admitting: Family Medicine

## 2011-07-11 ENCOUNTER — Ambulatory Visit (INDEPENDENT_AMBULATORY_CARE_PROVIDER_SITE_OTHER): Payer: PRIVATE HEALTH INSURANCE | Admitting: Family Medicine

## 2011-07-11 VITALS — BP 120/80 | Temp 98.4°F | Wt 196.0 lb

## 2011-07-11 DIAGNOSIS — G43809 Other migraine, not intractable, without status migrainosus: Secondary | ICD-10-CM

## 2011-07-11 MED ORDER — HYDROCODONE-ACETAMINOPHEN 7.5-750 MG PO TABS: 1.0000 | ORAL_TABLET | Freq: Four times a day (QID) | ORAL | Status: AC | PRN

## 2011-07-11 MED ORDER — PREDNISONE 20 MG PO TABS: ORAL_TABLET | ORAL | Status: DC

## 2011-07-11 NOTE — Patient Instructions (Signed)
Start the prednisone tonight as directed.  Vicodin one half to one tablet every 4 to 6 hours as needed for severe pain.  Return next Tuesday for follow-up

## 2011-07-11 NOTE — Progress Notes (Signed)
  Subjective:    Patient ID: Kevin Mcgee, male    DOB: 21-May-1971, 40 y.o.   MRN: [id number]  HPI  Run is a 40 year old male, married, nonsmoker, who comes in today for evaluation of a cluster migraine.  The last episode of cluster migraines.  He had was about 4 years ago.  Now the headache is returned.  It was triggered by some sleep dysfunction.  Now he wakes up and has a headache constant, dull, posterior of 4 on the scale of one to 10.  No neurologic symptoms.  Review of Systems    General and neurologic review of systems negative Objective:   Physical Exam  Well-developed well-nourished, male in no acute distress.  Examination HEENT negative.  Neck was supple.  No adenopathy.  Neurologic exam normal, including normal optic disks      Assessment & Plan:  Cluster migraine.  Plan prednisone burst and taper return next Tuesday for follow-up

## 2011-07-16 ENCOUNTER — Ambulatory Visit (INDEPENDENT_AMBULATORY_CARE_PROVIDER_SITE_OTHER): Payer: PRIVATE HEALTH INSURANCE | Admitting: Family Medicine

## 2011-07-16 ENCOUNTER — Encounter: Payer: Self-pay | Admitting: Family Medicine

## 2011-07-16 DIAGNOSIS — G43809 Other migraine, not intractable, without status migrainosus: Secondary | ICD-10-CM

## 2011-07-16 NOTE — Patient Instructions (Signed)
Take to prednisone tablets daily until Friday, and then begin to taper by taking one tablet for 5 days, a half for 5 days, then a half a tablet Monday, Wednesday, Friday, for a two week taper.  Return p.r.n.

## 2011-07-16 NOTE — Progress Notes (Signed)
  Subjective:    Patient ID: KAMUELA MAGOS, male    DOB: 1971/03/24, 40 y.o.   MRN: [id number]  HPI [name] is a 40 year old male, who comes in today for follow-up of cluster migraine.  We saw him last week with a flareup in his cluster migraines and start him on 60 mg of prednisone and then 40 daily, x 5 days.  He states his headache is about 70% gone.  No side effects from the medication   Review of Systems    General and neurologic review of systems otherwise negative Objective:   Physical Exam Well developed, well nourished, in no acute distress       Assessment & Plan:  Cluster migraine, taper prednisone return p.r.n.

## 2011-12-06 ENCOUNTER — Other Ambulatory Visit: Payer: Self-pay | Admitting: Family Medicine

## 2011-12-17 ENCOUNTER — Ambulatory Visit (INDEPENDENT_AMBULATORY_CARE_PROVIDER_SITE_OTHER): Payer: BC Managed Care – PPO | Admitting: Family

## 2011-12-17 ENCOUNTER — Encounter: Payer: Self-pay | Admitting: Family

## 2011-12-17 VITALS — BP 124/88 | Temp 98.9°F | Wt 196.0 lb

## 2011-12-17 DIAGNOSIS — R300 Dysuria: Secondary | ICD-10-CM

## 2011-12-17 DIAGNOSIS — Z7251 High risk heterosexual behavior: Secondary | ICD-10-CM

## 2011-12-17 LAB — POCT URINALYSIS DIPSTICK
Blood, UA: NEGATIVE
Glucose, UA: NEGATIVE
Ketones, UA: NEGATIVE
Nitrite, UA: NEGATIVE
Protein, UA: NEGATIVE
Spec Grav, UA: 1.02
Urobilinogen, UA: 0.2

## 2011-12-17 NOTE — Progress Notes (Signed)
Subjective:    Patient ID: Kevin Mcgee, male    DOB: [birth year], 41 y.o.   MRN: [id number]  HPI  41 year old African American male, nonsmoker, patient of Dr. Tawanna Cooler in today with complaint of lower abdominal pain rates it a 3/10, described as cramping. No aggravating or relieving factors. Patient also has concerns of irritation-burning sensation at the tip of his penis present for 9 days. He reports urinary frequency. Denies any urinary urgency, blood in his urine, back pain. Patient is sexually active with one male partner. Denies any concerns of STDs. He was screened for gonorrhea and chlamydia at the urgent care clinic and was negative.  Review of Systems  Constitutional: Negative.   Respiratory: Negative.   Cardiovascular: Negative.   Genitourinary: Positive for dysuria and frequency. Negative for urgency, decreased urine volume, discharge, scrotal swelling and testicular pain.  Musculoskeletal: Negative.   Skin: Negative.   Neurological: Negative.   Hematological: Negative.   Psychiatric/Behavioral: Negative.    Past Medical History  Diagnosis Date  . Sarcoidosis   . Allergy   . Asthma     History   Social History  . Marital Status: Married    Spouse Name: N/A    Number of Children: N/A  . Years of Education: N/A   Occupational History  . Not on file.   Social History Main Topics  . Smoking status: Current Some Day Smoker  . Smokeless tobacco: Never Used  . Alcohol Use: Yes  . Drug Use: No  . Sexually Active:    Other Topics Concern  . Not on file   Social History Narrative  . No narrative on file    No past surgical history on file.  Family History  Problem Relation Age of Onset  . Hypertension Other     No Known Allergies  Current Outpatient Prescriptions on File Prior to Visit  Medication Sig Dispense Refill  . cetirizine (ZYRTEC) 10 MG tablet Take 10 mg by mouth daily.        . fluticasone (FLONASE) 50 MCG/ACT nasal spray ONE SPRAY TO EACH  NOSTRIL TWO TIMES A DAY  16 g  2  . Multiple Vitamin (MULTIVITAMIN) tablet Take 1 tablet by mouth daily.        Marland Kitchen omeprazole (PRILOSEC) 20 MG capsule Take 20 mg by mouth daily.        Marland Kitchen HYDROcodone-acetaminophen (VICODIN ES) 7.5-750 MG per tablet Take 1 tablet by mouth every 6 (six) hours as needed for pain.  30 tablet  1  . predniSONE (DELTASONE) 20 MG tablet Three tabs now then two tabs q.a.m. X 5 days, one x 5 days, a half x 5 days, then a half a tablet Monday, Wednesday, Friday, for a two-week taper  40 tablet  1    BP 124/88  Temp(Src) 98.9 F (37.2 C) (Oral)  Wt 196 lb (88.905 kg)chart    Objective:   Physical Exam  Constitutional: He is oriented to person, place, and time. He appears well-developed and well-nourished.  Cardiovascular: Normal rate, regular rhythm and normal heart sounds.   Pulmonary/Chest: Effort normal and breath sounds normal.  Abdominal: Soft. Bowel sounds are normal. He exhibits no mass. There is no tenderness. There is no rebound and no guarding.  Genitourinary: Penis normal.  Neurological: He is alert and oriented to person, place, and time.  Skin: Skin is warm and dry.  Psychiatric: He has a normal mood and affect.          Assessment &  Plan:  Assessment: Dysuria, lower, pain, high risk sexual behavior  Plan: Labs done today include urine culture, HSV 1 and 2. Will notify patient pending results. Followup sooner when necessary.

## 2011-12-18 LAB — HIV ANTIBODY (ROUTINE TESTING W REFLEX): HIV: NONREACTIVE

## 2011-12-18 LAB — HSV(HERPES SIMPLEX VRS) I + II AB-IGG
HSV 1 Glycoprotein G Ab, IgG: <0.1 IV
HSV 2 Glycoprotein G Ab, IgG: 0.19 IV

## 2011-12-19 LAB — URINE CULTURE
Colony Count: NO GROWTH
Organism ID, Bacteria: NO GROWTH

## 2011-12-20 ENCOUNTER — Telehealth: Payer: Self-pay | Admitting: *Deleted

## 2011-12-20 NOTE — Telephone Encounter (Signed)
Pt was given his lab results, and he will call us if his Urinary complaints do not resolve re: an Urology consult.

## 2012-01-28 ENCOUNTER — Telehealth: Payer: Self-pay | Admitting: Family Medicine

## 2012-01-28 NOTE — Telephone Encounter (Signed)
Left message on machine for patient

## 2012-01-28 NOTE — Telephone Encounter (Signed)
Dr. Bjorn Pippin,,,,,,,,,,, patient can call and make his own appointment

## 2012-01-28 NOTE — Telephone Encounter (Signed)
Pt requesting referral to urologist.

## 2012-02-11 ENCOUNTER — Encounter: Payer: Self-pay | Admitting: Family Medicine

## 2012-02-11 ENCOUNTER — Ambulatory Visit (INDEPENDENT_AMBULATORY_CARE_PROVIDER_SITE_OTHER): Payer: BC Managed Care – PPO | Admitting: Family Medicine

## 2012-02-11 VITALS — BP 120/80 | Temp 98.4°F | Wt 196.0 lb

## 2012-02-11 DIAGNOSIS — R34 Anuria and oliguria: Secondary | ICD-10-CM

## 2012-02-11 DIAGNOSIS — R209 Unspecified disturbances of skin sensation: Secondary | ICD-10-CM | POA: Insufficient documentation

## 2012-02-11 DIAGNOSIS — R350 Frequency of micturition: Secondary | ICD-10-CM

## 2012-02-11 LAB — POCT URINALYSIS DIPSTICK
Bilirubin, UA: NEGATIVE
Blood, UA: NEGATIVE
Leukocytes, UA: NEGATIVE
Nitrite, UA: NEGATIVE
Protein, UA: NEGATIVE
Spec Grav, UA: <=1.005
Urobilinogen, UA: 0.2
pH, UA: 6

## 2012-02-11 NOTE — Patient Instructions (Signed)
Return when necessary 

## 2012-02-11 NOTE — Progress Notes (Signed)
  Subjective:    Patient ID: Kevin Mcgee, male    DOB: 14-Nov-1970, 41 y.o.   MRN: [id number]  HPI Kevin Mcgee is a 41 year old male who comes in today for a sensation on the tip of his penis  He states he has electric sensation in the tip of his penis that comes and goes. No urinary tract symptoms. No history of trauma. He was seen here while back by the nurse practitioner examination was normal STD screening negative   Review of Systems    general and urologic review of systems negative Objective:   Physical Exam  Well-developed well-nourished male in no acute distress.  Genital exam showed a well circumcised male completely normal exam  Urinalysis normal      Assessment & Plan:  Tingling sensation of the penis unknown etiology reassured

## 2012-07-13 ENCOUNTER — Encounter: Payer: Self-pay | Admitting: Family Medicine

## 2012-07-13 ENCOUNTER — Telehealth: Payer: Self-pay | Admitting: Family Medicine

## 2012-07-13 ENCOUNTER — Ambulatory Visit (INDEPENDENT_AMBULATORY_CARE_PROVIDER_SITE_OTHER): Payer: BC Managed Care – PPO | Admitting: Family Medicine

## 2012-07-13 VITALS — BP 120/80 | Temp 97.9°F | Wt 190.0 lb

## 2012-07-13 DIAGNOSIS — G43809 Other migraine, not intractable, without status migrainosus: Secondary | ICD-10-CM

## 2012-07-13 DIAGNOSIS — J309 Allergic rhinitis, unspecified: Secondary | ICD-10-CM

## 2012-07-13 DIAGNOSIS — D869 Sarcoidosis, unspecified: Secondary | ICD-10-CM

## 2012-07-13 DIAGNOSIS — J45909 Unspecified asthma, uncomplicated: Secondary | ICD-10-CM

## 2012-07-13 MED ORDER — VARENICLINE TARTRATE 0.5 MG PO TABS: ORAL_TABLET | ORAL | Status: DC

## 2012-07-13 MED ORDER — DOXYCYCLINE HYCLATE 100 MG PO TABS: 100.0000 mg | ORAL_TABLET | Freq: Two times a day (BID) | ORAL | Status: DC

## 2012-07-13 MED ORDER — HYDROCODONE-HOMATROPINE 5-1.5 MG/5ML PO SYRP: 5.0000 mL | ORAL_SOLUTION | Freq: Three times a day (TID) | ORAL | Status: DC | PRN

## 2012-07-13 MED ORDER — PREDNISONE 20 MG PO TABS: ORAL_TABLET | ORAL | Status: DC

## 2012-07-13 NOTE — Telephone Encounter (Signed)
Okay to work in at 1:30

## 2012-07-13 NOTE — Telephone Encounter (Signed)
Pt called and is in [place], [place] this morning. Pt has ?sinus inf and is req a work in [employer] with Dr Tawanna Cooler today after 1pm. Pls advise.

## 2012-07-13 NOTE — Telephone Encounter (Signed)
Spoke with patient appt made

## 2012-07-13 NOTE — Patient Instructions (Signed)
Stop smoking completely  To help that process I would recommend the Chantix one half tab daily for 2 months  Drink lots of water  Begin the prednisone as outlined  Doxycycline 100 mg twice a day for 10 days  Hydromet 1/2-1 teaspoon at bedtime when necessary for cough  Return in one week for followup sooner if any problems

## 2012-07-13 NOTE — Progress Notes (Signed)
  Subjective:    Patient ID: Kevin Mcgee, male    DOB: Sep 17, 1970, 41 y.o.   MRN: [id number]  HPI Kevin Mcgee is a 42 year old single male smoker,,,,,,,,,,, states he quit 3 days ago,,,,,,,,,, who comes in today with a week's history of head congestion postnasal drip and cough  His symptoms started about a week ago with postnasal drip. Over the weekend he developed a cough any stop smoking 3 days ago. He's also had a lot of head congestion. No fever no chills he is bringing up some discolored sputum. He does have a history of allergic rhinitis asthma and sarcoidosis.,    Review of Systems General and pulmonary review of systems otherwise negative     Objective:   Physical Exam Well-developed well-nourished male in no acute distress HEENT were pertinent he has 3+ nasal edema a red left tympanic membrane neck was supple no adenopathy lungs show symmetrical breath sounds bilateral wheezing       Assessment & Plan:  Allergic rhiniti, asthma, tobacco abuse,,,,,,,,,,,,,,, continue to refrain from smoking and recommended the Chantix program, doxycycline 100 mg twice a day, prednisone burst and taper, continue the Zyrtec and Flonase, return in one week for followup

## 2012-07-21 ENCOUNTER — Encounter: Payer: Self-pay | Admitting: Family Medicine

## 2012-07-21 ENCOUNTER — Ambulatory Visit (INDEPENDENT_AMBULATORY_CARE_PROVIDER_SITE_OTHER): Payer: BC Managed Care – PPO | Admitting: Family Medicine

## 2012-07-21 VITALS — BP 130/80 | Temp 98.2°F | Wt 188.0 lb

## 2012-07-21 DIAGNOSIS — F17200 Nicotine dependence, unspecified, uncomplicated: Secondary | ICD-10-CM

## 2012-07-21 DIAGNOSIS — J45909 Unspecified asthma, uncomplicated: Secondary | ICD-10-CM

## 2012-07-21 NOTE — Progress Notes (Signed)
  Subjective:    Patient ID: Kevin Mcgee, male    DOB: June 27, 1971, 41 y.o.   MRN: [id number]  HPI Kevin Mcgee is a 41  year-old male who comes in today for evaluation of asthma and tobacco abuse  We saw him a week ago with severe asthma. We started him on 60 mg of prednisone stat and then 40 mg for 5 days with a taper. He is on his third day of 20 mg of prednisone daily and feels 75% improved. He's able sleep at night no coughing or shortness of breath. He does have some insomnia from the Chantix   Review of Systems General and pulmonary review of systems otherwise negative    Objective:   Physical Exam Well-developed well-nourished male no acute distress pulmonary exam normal except for some faint symmetrical late expiratory wheezing       Assessment & Plan:  Asthma resolving plan Taper prednisone  Tobacco abuse begin the Chantix program ASAP no more smoking

## 2012-07-21 NOTE — Patient Instructions (Signed)
Taper the prednisone as follows,,,,,,,, one half tab daily x3 days then one half tab every other day for a 2 week taper  Start the Chantix one half tab daily  No smoking  Continue the Chantix one half tab daily for 3 months then stop the Chantix

## 2012-09-24 ENCOUNTER — Ambulatory Visit (INDEPENDENT_AMBULATORY_CARE_PROVIDER_SITE_OTHER): Payer: BC Managed Care – PPO | Admitting: Family Medicine

## 2012-09-24 ENCOUNTER — Encounter: Payer: Self-pay | Admitting: Family Medicine

## 2012-09-24 VITALS — BP 120/80 | Temp 98.5°F | Wt 193.0 lb

## 2012-09-24 DIAGNOSIS — L821 Other seborrheic keratosis: Secondary | ICD-10-CM

## 2012-09-24 DIAGNOSIS — R141 Gas pain: Secondary | ICD-10-CM

## 2012-09-24 DIAGNOSIS — L988 Other specified disorders of the skin and subcutaneous tissue: Secondary | ICD-10-CM

## 2012-09-24 DIAGNOSIS — L738 Other specified follicular disorders: Secondary | ICD-10-CM

## 2012-09-24 DIAGNOSIS — R209 Unspecified disturbances of skin sensation: Secondary | ICD-10-CM

## 2012-09-24 DIAGNOSIS — L853 Xerosis cutis: Secondary | ICD-10-CM

## 2012-09-24 DIAGNOSIS — R208 Other disturbances of skin sensation: Secondary | ICD-10-CM

## 2012-09-24 NOTE — Progress Notes (Signed)
  Subjective:    Patient ID: Kevin Mcgee, male    DOB: [birth year], 42 y.o.   MRN: [id number]  HPI Acute visit. Patient seen for several following items  Generally dry skin. Uses Eucerin cream after bathing and also using room humidifier. Generally controls dryness and itching.    Recently his had some vague mild bilateral symmetric burning dysesthesias both forearms. No significant associated rash. No neck pain. No weakness. Possibly exacerbated by things like clothes touching skin. No alleviating factors. Using moisturizer cream and no major dryness of arms. Does not have any burning involving the legs or other skin areas. Symptoms are relatively mild  Patient has some hyperpigmented papilloma type lesions under both eyes. Just wanted to make sure these were not dangerous in any way.  These been present for couple of years and increasing in numbers  Couple day history of some abdominal cramps. Intermittent left abdomen. No nausea or vomiting. No appetite or weight changes. No fever. No chills. No dysuria. No change in bowel habits. Patient thought this may have been gaseous. No clear exacerbating factors. Asymptomatic at this time.  Past Medical History  Diagnosis Date  . Sarcoidosis   . Allergy   . Asthma    No past surgical history on file.  reports that he has been smoking Cigarettes.  He has been smoking about .25 packs per day. He has never used smokeless tobacco. He reports that he drinks alcohol. He reports that he does not use illicit drugs. family history includes Hypertension in his other. No Known Allergies    Review of Systems  Constitutional: Negative for fever, chills, appetite change and unexpected weight change.  HENT: Negative for trouble swallowing.   Respiratory: Negative for cough and shortness of breath.   Cardiovascular: Negative for chest pain.  Gastrointestinal: Negative for nausea, vomiting, diarrhea, constipation, blood in stool and abdominal distention.   Genitourinary: Negative for dysuria.  Neurological: Negative for weakness and numbness.       Objective:   Physical Exam  Constitutional: He appears well-developed and well-nourished.  HENT:  Right Ear: External ear normal.  Left Ear: External ear normal.  Mouth/Throat: Oropharynx is clear and moist.  Neck: Neck supple. No thyromegaly present.  Cardiovascular: Normal rate and regular rhythm.   Pulmonary/Chest: Effort normal and breath sounds normal. No respiratory distress. He has no wheezes. He has no rales.  Abdominal: Soft. Bowel sounds are normal. He exhibits no distension and no mass. There is no tenderness. There is no rebound and no guarding.  Lymphadenopathy:    He has no cervical adenopathy.  Skin:       Patient has some small hyperpigmented papillomatous type lesions under both eyes          Assessment & Plan:  #1 dry skin. Emphasized brief bathing, antihistamines to control itching and continue moisturizing lotion. #2 burning dysesthesias forearms. Unremarkable exam. Reassurance. Try antihistamine to see if this has an initial response #3 fleeting abdominal pains suspect gaseous. Nonfocal exam. Followup promptly for any fever, stool changes or persistent or worsening pain #4 dermatosis papulosis nigrans. Reassured this is benign

## 2012-09-24 NOTE — Patient Instructions (Addendum)
Try icing for forearm dysesthesias.   Consider over the counter anti histamine such as benadryl, Allegra, or Zyrtec.

## 2012-10-21 ENCOUNTER — Ambulatory Visit (INDEPENDENT_AMBULATORY_CARE_PROVIDER_SITE_OTHER): Payer: BC Managed Care – PPO | Admitting: Family Medicine

## 2012-10-21 ENCOUNTER — Encounter: Payer: Self-pay | Admitting: Family Medicine

## 2012-10-21 VITALS — BP 120/80 | Temp 99.7°F | Wt 192.0 lb

## 2012-10-21 DIAGNOSIS — L259 Unspecified contact dermatitis, unspecified cause: Secondary | ICD-10-CM | POA: Insufficient documentation

## 2012-10-21 MED ORDER — TRIAMCINOLONE ACETONIDE 0.5 % EX OINT: TOPICAL_OINTMENT | Freq: Two times a day (BID) | CUTANEOUS | Status: DC

## 2012-10-21 NOTE — Progress Notes (Signed)
  Subjective:    Patient ID: Kevin Mcgee, male    DOB: February 13, 1971, 42 y.o.   MRN: [id number]  HPI Kevin Mcgee is a 42 year old married male ,,,,,,,,, occupation accounting,,,,,,,,,, who comes in with a one-month history of a skin rash  He first noticed a month ago in his arms and now spread. He's had a history of this problem the past and is used triamcinolone with Eucerin with good results   Review of Systems    review of systems otherwise negative Objective:   Physical Exam Well-developed well-nourished thin male no acute distress examination of skin shows multiple papular lesions throughout the arms abdomen chest and back       Assessment & Plan:  Contact dermatitis triamcinolone with Eucerin twice a day when necessary

## 2012-10-21 NOTE — Patient Instructions (Signed)
Apply small amounts of the triamcinolone twice daily return when necessary

## 2012-10-26 ENCOUNTER — Telehealth: Payer: Self-pay | Admitting: Family Medicine

## 2012-10-26 NOTE — Telephone Encounter (Signed)
Patient Information:  Caller Name: Lovell  Phone: [phone]  Patient: Kevin Mcgee, Kevin Mcgee  Gender: Male  DOB: 03/25/1971  Age: 42 Years  PCP: Kelle Darting [facility])  Office Follow Up:  Does the office need to follow up with this patient?: No  Instructions For The Office: N/A  RN Note:  Patient verbalized understanding of care advice.  Symptoms  Reason For Call & Symptoms: Saw last week about probable Eczema on lower arm and sides and back,  Now has littlle red bumps and red unraised area on trunk and waistline.  Reviewed Health History In EMR: Yes  Reviewed Medications In EMR: Yes  Reviewed Allergies In EMR: Yes  Reviewed Surgeries / Procedures: Yes  Date of Onset of Symptoms: 10/22/2012  Guideline(s) Used:  Rash or Redness - Widespread  Disposition Per Guideline:   See Today in Office  Reason For Disposition Reached:   Patient wants to be seen  Advice Given:  Reassurance:  There are many causes of widespread rashes and most of the time they are not serious. Common causes include viral illness (e.g., cold viruses) and allergic reactions (to a food, medicine, or environmental exposure).  For Itchy Rashes:  Wash the skin once with gentle non-perfumed soap to remove any irritants. Rinse the soap off thoroughly.  You may also take an oatmeal (Aveeno) bath or take an antihistamine medication by mouth to help reduce the itching.  Oral Antihistamine Medication for Itching:  Take an antihistamine like diphenhydramine (Benadryl) for widespread rashes that itch. The adult dosage of Benadryl is 25-50 mg by mouth 4 times daily.  An over-the-counter antihistamine that causes less sleepiness is loratadine (e.g., Alavert or Claritin).  CAUTION: This type of medication may cause sleepiness. Do not drink alcohol, drive, or operate dangerous machinery while taking antihistamines. Do not take these medications if you have prostate problems.  Contagiousness:  Avoid contact with  pregnant women until a diagnosis is made. Most viral rashes are contagious (especially if a fever is present). You can return to work or school after the rash is gone or when your doctor says it is safe to return with the rash.  Call Back If:   You become worse  Appointment Scheduled:  10/28/2012 10:00:00 Appointment Scheduled Provider:  Kelle Darting [facility])

## 2012-10-28 ENCOUNTER — Ambulatory Visit (INDEPENDENT_AMBULATORY_CARE_PROVIDER_SITE_OTHER): Payer: BC Managed Care – PPO | Admitting: Family Medicine

## 2012-10-28 ENCOUNTER — Encounter: Payer: Self-pay | Admitting: Family Medicine

## 2012-10-28 VITALS — BP 120/80 | Temp 98.6°F | Wt 194.0 lb

## 2012-10-28 DIAGNOSIS — L259 Unspecified contact dermatitis, unspecified cause: Secondary | ICD-10-CM

## 2012-10-28 NOTE — Patient Instructions (Signed)
Prednisone 20 mg,,,,,,,,,, 1 tab x7 days, a half a tab x7 days, then a half a tablet Monday Wednesday Friday for a two-week taper  Return when necessary

## 2012-10-28 NOTE — Progress Notes (Signed)
  Subjective:    Patient ID: Kevin Mcgee, male    DOB: 10-07-1970, 42 y.o.   MRN: [id number]  HPI Tarl is a 42 year old male who comes in today for reevaluation of a skin rash  We saw last week and it. He had some dyshidrotic eczema. We started him on some triamcinolone with a moisturizer twice daily. That seem to help the eczema however in the meantime if broken half trunk arms and legs with red raised lesions that are only mildly periodic.   Review of Systems    review of systems otherwise negative Objective:   Physical Exam  Well-developed well-nourished male in no acute distress examination skin shows multiple small red papules arms legs back and chest      Assessment & Plan:  Contact dermatitis plan prednisone burst and taper return when necessary

## 2012-11-02 ENCOUNTER — Telehealth: Payer: Self-pay | Admitting: *Deleted

## 2012-11-02 NOTE — Telephone Encounter (Signed)
Patient is calling because the rash that he was treated for is now spreading.  He has tried to set up an appointment with dermatology but it will take about a month to be seen.  He would like to know:  1.  Should he continue his prednisone and triamcinolone cream? 2.  Should he come in for another office visit?

## 2012-11-02 NOTE — Telephone Encounter (Signed)
Yes. Continue present regimen. Please attempt to schedule a prompt dermatology evaluation

## 2012-11-03 NOTE — Telephone Encounter (Signed)
Spoke with patient and he will continue with medication and he has schedule an appointment with Kevin Mcgee for this week.

## 2013-03-31 ENCOUNTER — Encounter: Payer: Self-pay | Admitting: Family Medicine

## 2013-03-31 ENCOUNTER — Ambulatory Visit (INDEPENDENT_AMBULATORY_CARE_PROVIDER_SITE_OTHER): Payer: BC Managed Care – PPO | Admitting: Family Medicine

## 2013-03-31 DIAGNOSIS — M26609 Unspecified temporomandibular joint disorder, unspecified side: Secondary | ICD-10-CM

## 2013-03-31 DIAGNOSIS — R229 Localized swelling, mass and lump, unspecified: Secondary | ICD-10-CM

## 2013-03-31 DIAGNOSIS — R2242 Localized swelling, mass and lump, left lower limb: Secondary | ICD-10-CM

## 2013-03-31 DIAGNOSIS — T679XXA Effect of heat and light, unspecified, initial encounter: Secondary | ICD-10-CM

## 2013-03-31 LAB — HEPATIC FUNCTION PANEL
ALT: 33 U/L (ref 0–53)
AST: 26 U/L (ref 0–37)
Albumin: 4.3 g/dL (ref 3.5–5.2)
Bilirubin, Direct: 0 mg/dL (ref 0.0–0.3)
Total Bilirubin: 0.5 mg/dL (ref 0.3–1.2)
Total Protein: 7.3 g/dL (ref 6.0–8.3)

## 2013-03-31 LAB — CBC WITH DIFFERENTIAL/PLATELET
Basophils Absolute: 0 10*3/uL (ref 0.0–0.1)
Basophils Relative: 0.4 % (ref 0.0–3.0)
Eosinophils Absolute: 0.3 10*3/uL (ref 0.0–0.7)
Eosinophils Relative: 4.9 % (ref 0.0–5.0)
Hemoglobin: 16.4 g/dL (ref 13.0–17.0)
Lymphocytes Relative: 19.6 % (ref 12.0–46.0)
Lymphs Abs: 1.2 10*3/uL (ref 0.7–4.0)
MCV: 91.8 fl (ref 78.0–100.0)
Monocytes Relative: 12.7 % — ABNORMAL HIGH (ref 3.0–12.0)
Neutro Abs: 4 10*3/uL (ref 1.4–7.7)
Platelets: 383 10*3/uL (ref 150.0–400.0)
RDW: 13.5 % (ref 11.5–14.6)
WBC: 6.3 10*3/uL (ref 4.5–10.5)

## 2013-03-31 LAB — BASIC METABOLIC PANEL
BUN: 17 mg/dL (ref 6–23)
Calcium: 9.4 mg/dL (ref 8.4–10.5)
Chloride: 104 mEq/L (ref 96–112)
Creatinine, Ser: 1.2 mg/dL (ref 0.4–1.5)
GFR: 89.5 mL/min (ref >60.00)
Sodium: 138 mEq/L (ref 135–145)

## 2013-03-31 LAB — POCT URINALYSIS DIPSTICK
Bilirubin, UA: NEGATIVE
Blood, UA: NEGATIVE
Glucose, UA: NEGATIVE
Ketones, UA: NEGATIVE
Leukocytes, UA: NEGATIVE
Nitrite, UA: NEGATIVE
Urobilinogen, UA: 0.2
pH, UA: 6.5

## 2013-03-31 LAB — TSH: TSH: 0.86 u[IU]/mL (ref 0.35–5.50)

## 2013-03-31 MED ORDER — FLUTICASONE PROPIONATE 50 MCG/ACT NA SUSP: 2.0000 | Freq: Every day | NASAL | Status: DC

## 2013-03-31 NOTE — Progress Notes (Signed)
  Subjective:    Patient ID: Kevin Mcgee, male    DOB: 1971-01-23, 42 y.o.   MRN: [id number]  HPI Here for several things. First he has had clicking in the right jaw for 2 months with no pain. He does not chew gum. Second for one month he has had a non-tender lump in the left calf. Third for the past month he feels hot all the time. No new meds. No weight changes. He does not sweat but feels like he is about to.    Review of Systems  Constitutional: Negative.   Endocrine: Positive for heat intolerance. Negative for cold intolerance, polydipsia, polyphagia and polyuria.       Objective:   Physical Exam  Constitutional: He appears well-developed and well-nourished. No distress.  HENT:  The right TMJ clicks but has full ROM and is not tender  Neck: No thyromegaly present.  Cardiovascular: Normal rate, regular rhythm, normal heart sounds and intact distal pulses.   Pulmonary/Chest: Effort normal and breath sounds normal.  Musculoskeletal:  There is a small round non-tender lump in the left calf beneath th dermal layers and on top of the gastrocnemius  Lymphadenopathy:    He has no cervical adenopathy.          Assessment & Plan:  He has early TMJ. Advised him to see his dentist to check his bite. He has a small cyst over a tendon in the calf. Observe only. We will get labs to look for causes of feeling hot.

## 2013-05-19 ENCOUNTER — Ambulatory Visit (INDEPENDENT_AMBULATORY_CARE_PROVIDER_SITE_OTHER): Payer: BC Managed Care – PPO | Admitting: Internal Medicine

## 2013-05-19 ENCOUNTER — Encounter: Payer: Self-pay | Admitting: Internal Medicine

## 2013-05-19 ENCOUNTER — Other Ambulatory Visit (INDEPENDENT_AMBULATORY_CARE_PROVIDER_SITE_OTHER): Payer: BC Managed Care – PPO

## 2013-05-19 VITALS — BP 138/90 | HR 85 | Temp 97.8°F | Resp 16 | Wt 195.0 lb

## 2013-05-19 DIAGNOSIS — R300 Dysuria: Secondary | ICD-10-CM | POA: Insufficient documentation

## 2013-05-19 DIAGNOSIS — N410 Acute prostatitis: Secondary | ICD-10-CM

## 2013-05-19 LAB — URINALYSIS, ROUTINE W REFLEX MICROSCOPIC
Bilirubin Urine: NEGATIVE
Ketones, ur: NEGATIVE
Leukocytes, UA: NEGATIVE
Nitrite: NEGATIVE
RBC / HPF: NONE SEEN (ref 0–?)
Specific Gravity, Urine: >=1.03 (ref 1.000–1.030)
Total Protein, Urine: NEGATIVE
pH: 6 (ref 5.0–8.0)

## 2013-05-19 MED ORDER — CIPROFLOXACIN HCL 500 MG PO TABS: 500.0000 mg | ORAL_TABLET | Freq: Two times a day (BID) | ORAL | Status: DC

## 2013-05-19 NOTE — Assessment & Plan Note (Signed)
I will check his urine for infection

## 2013-05-19 NOTE — Patient Instructions (Signed)
Prostatitis  The prostate gland is about the size and shape of a walnut. It is located just below your bladder. It produces one of the components of semen, which is made up of sperm and the fluids that help nourish and transport it out from the testicles. Prostatitis is redness, soreness, and swelling (inflammation) of the prostate gland.   There are 3 types of prostatitis:  · Acute bacterial prostatitis This is the least common type of prostatitis. It starts quickly and usually leads to a bladder infection. It can occur at any age.  · Chronic bacterial prostatitis This is a persistent bacterial infection in the prostate.  It usually develops from repeated acute bacterial prostatitis or acute bacterial prostatitis that was not properly treated. It can occur in men of any age but is most common in middle-aged men whose prostate has begun to enlarge.   · Chronic prostatitis chronic pelvic pain syndrome This is the most common type of prostatitis. It is inflammation of the prostate gland that is not caused by a bacterial infection. The cause is unknown.  CAUSES  The cause of acute and chronic bacterial prostatitis is a bacterial infection. The exact cause of chronic prostatitis and chronic pelvic pain syndrome and asymptomatic inflammatory prostatitis is unknown.   SYMPTOMS   Symptoms can vary depending upon the type of prostatitis that exists. There can also be overlap in symptoms. Possible symptoms for each type of prostatitis are listed below.  Acute bacterial prostatitis  · Painful urination.  · Fever or chills.  · Muscle or joint pains.  · Low back pain.  · Low abdominal pain.  · Inability to empty bladder completely.  · Sudden urge to urinate.  · Frequent urination.  · Difficulty starting urine stream.  · Weak urine stream.  · Discharge from the urethra.  · Dribbling after urination.  · Rectal pain.  · Pain in the testicles, penis, or tip of the penis.  · Pain in the space between the anus and scrotum  (perineum).  · Problems with sexual function.  · Painful ejaculation.  · Bloody semen.  Chronic bacterial prostatitis  · The symptoms are similar to those of acute bacterial prostatitis, but they usually are much less severe. Fever, chills, and muscle and joint pain are not associated with chronic bacterial prostatitis.  Chronic prostatitis chronic pelvic pain syndrome  · Symptoms typically include a dull ache in the scrotum and the perineum.  DIAGNOSIS   In order to diagnose prostatitis, your caregiver will ask about your symptoms. If acute or chronic bacterial prostatitis is suspected, a urine sample will be taken and tested (urinalysis). This is to see if there is bacteria in your urine. If the urinalysis result is negative for bacteria, your caregiver may use a finger to feel your prostate (digital rectal exam). This exam helps your caregiver determine if your prostate is swollen and tender.  TREATMENT   Treatment for prostatitis depends on the cause. If a bacterial infection is the cause, it can be treated with antibiotic medicine. In cases of chronic bacterial prostatitis, the use of antibiotics for up to 1 month may be necessary. Your caregiver may instruct you to take sitz baths to help relieve pain. A sitz bath is a bath of hot water in which your hips and buttocks are under water.  HOME CARE INSTRUCTIONS   · Take all medicines as directed by your caregiver.  · Take sitz baths as directed by your caregiver.  SEEK MEDICAL CARE   IF:   · Your symptoms get worse, not better.  · You have a fever.  SEEK IMMEDIATE MEDICAL CARE IF:   · You have chills.  · You feel nauseous or vomit.  · You feel lightheaded or faint.  · You are unable to urinate.  · You have blood or blood clots in your urine.  Document Released: 08/16/2000 Document Revised: 11/11/2011 Document Reviewed: 07/22/2011  [employer]® Patient Information ©2014 [employer].

## 2013-05-19 NOTE — Assessment & Plan Note (Signed)
I will check his UA He will start treating the infection with cipro

## 2013-05-19 NOTE — Progress Notes (Signed)
Subjective:    Patient ID: Kevin Mcgee, male    DOB: 1970/10/19, 42 y.o.   MRN: [id number]  Dysuria  This is a new problem. The current episode started in the past 7 days. The problem occurs every urination. The problem has been unchanged. The quality of the pain is described as burning. The pain is at a severity of 1/10. There has been no fever. The fever has been present for less than 1 day. He is sexually active. There is no history of pyelonephritis. Pertinent negatives include no chills, discharge, flank pain, frequency, hematuria, hesitancy, nausea, sweats, urgency or vomiting. He has tried nothing for the symptoms.      Review of Systems  Constitutional: Negative.  Negative for fever, chills, diaphoresis, appetite change and fatigue.  HENT: Negative.   Eyes: Negative.   Respiratory: Negative.   Cardiovascular: Negative.  Negative for chest pain, palpitations and leg swelling.  Gastrointestinal: Negative.  Negative for nausea, vomiting, abdominal pain, diarrhea and constipation.  Endocrine: Negative.   Genitourinary: Positive for dysuria. Negative for hesitancy, urgency, frequency, hematuria, flank pain, discharge, penile swelling, scrotal swelling, difficulty urinating, genital sores, penile pain and testicular pain.  Musculoskeletal: Negative.   Skin: Negative.  Negative for rash.  Allergic/Immunologic: Negative.   Neurological: Negative.   Hematological: Negative.   Psychiatric/Behavioral: Negative.        Objective:   Physical Exam  Vitals reviewed. Constitutional: He is oriented to person, place, and time. He appears well-developed and well-nourished. No distress.  HENT:  Head: Normocephalic and atraumatic.  Mouth/Throat: Oropharynx is clear and moist. No oropharyngeal exudate.  Eyes: Conjunctivae are normal. Right eye exhibits no discharge. Left eye exhibits no discharge. No scleral icterus.  Neck: Normal range of motion. Neck supple. No JVD present. No tracheal  deviation present. No thyromegaly present.  Cardiovascular: Normal rate, regular rhythm and intact distal pulses.  Exam reveals no gallop and no friction rub.   No murmur heard. Pulmonary/Chest: Effort normal and breath sounds normal. No stridor. No respiratory distress. He has no wheezes. He has no rales. He exhibits no tenderness.  Abdominal: Soft. Bowel sounds are normal. He exhibits no distension and no mass. There is no tenderness. There is no rebound and no guarding. Hernia confirmed negative in the right inguinal area and confirmed negative in the left inguinal area.  Genitourinary: Rectum normal, testes normal and penis normal. Rectal exam shows no external hemorrhoid, no internal hemorrhoid, no fissure, no mass, no tenderness and anal tone normal. Guaiac negative stool. Prostate is enlarged (1+ BPH, right lobe larger than left and it is boggy) and tender. Right testis shows no mass, no swelling and no tenderness. Right testis is descended. Left testis shows no mass, no swelling and no tenderness. Left testis is descended. Circumcised. No penile erythema or penile tenderness. No discharge found.  Musculoskeletal: Normal range of motion. He exhibits no edema and no tenderness.  Lymphadenopathy:    He has no cervical adenopathy.       Right: No inguinal adenopathy present.       Left: No inguinal adenopathy present.  Neurological: He is oriented to person, place, and time.  Skin: Skin is warm and dry. No rash noted. He is not diaphoretic. No erythema. No pallor.  Psychiatric: He has a normal mood and affect. His behavior is normal. Judgment and thought content normal.      Lab Results  Component Value Date   WBC 6.3 03/31/2013   HGB 16.4 03/31/2013  HCT 49.9 03/31/2013   PLT 383.0 03/31/2013   GLUCOSE 91 03/31/2013   ALT 33 03/31/2013   AST 26 03/31/2013   NA 138 03/31/2013   K 4.2 03/31/2013   CL 104 03/31/2013   CREATININE 1.2 03/31/2013   BUN 17 03/31/2013   CO2 29 03/31/2013   TSH 0.86  03/31/2013      Assessment & Plan:

## 2013-05-20 ENCOUNTER — Encounter: Payer: Self-pay | Admitting: Internal Medicine

## 2013-05-20 LAB — CULTURE, URINE COMPREHENSIVE
Colony Count: NO GROWTH
Organism ID, Bacteria: NO GROWTH

## 2013-05-20 LAB — GC/CHLAMYDIA PROBE AMP, URINE
Chlamydia, Swab/Urine, PCR: NEGATIVE
GC Probe Amp, Urine: NEGATIVE

## 2013-07-08 ENCOUNTER — Other Ambulatory Visit: Payer: Self-pay

## 2013-11-08 ENCOUNTER — Ambulatory Visit (INDEPENDENT_AMBULATORY_CARE_PROVIDER_SITE_OTHER): Payer: BC Managed Care – PPO | Admitting: Family Medicine

## 2013-11-08 ENCOUNTER — Encounter: Payer: Self-pay | Admitting: Family Medicine

## 2013-11-08 VITALS — BP 140/90 | Temp 98.3°F | Wt 202.0 lb

## 2013-11-08 DIAGNOSIS — G43809 Other migraine, not intractable, without status migrainosus: Secondary | ICD-10-CM

## 2013-11-08 DIAGNOSIS — F17200 Nicotine dependence, unspecified, uncomplicated: Secondary | ICD-10-CM

## 2013-11-08 MED ORDER — VARENICLINE TARTRATE 1 MG PO TABS: ORAL_TABLET | ORAL | Status: DC

## 2013-11-08 MED ORDER — PREDNISONE 20 MG PO TABS: ORAL_TABLET | ORAL | Status: DC

## 2013-11-08 NOTE — Progress Notes (Signed)
Pre visit review using our clinic review tool, if applicable. No additional management support is needed unless otherwise documented below in the visit note. 

## 2013-11-08 NOTE — Patient Instructions (Signed)
Prednisone 20 mg,,,,,,,,,, 2 tabs daily for 3 days or until the headache abates and then taper as outlined  Chantix 1 mg......... one half tab daily for a week.......... then increase the dose to 1 tab daily  Begin tapering by 2 cigarettes per week

## 2013-11-08 NOTE — Progress Notes (Signed)
   Subjective:    Patient ID: Theresa DutyRonald G Hartzog, male    DOB: 01/13/1971, 43 y.o.   MRN: [id number]  HPI Windy FastRonald is a 43 year old male smoker 10 cigarettes a day who comes in today for evaluation of cluster migraine  He's had recent before in the past. This episode started about 3 weeks ago. He says his pain is at its worst is a 6......Marland Kitchen.7 on a scale of 1-10  He has no trouble with vision hearing numbness weakness etc.   Review of Systems Neurologic review of systems negative  He still smoking    Objective:   Physical Exam Well-developed well-nourished male no acute distress vital signs stable he is afebrile HEENT negative was supple no adenopathy       Assessment & Plan:  Cluster migraines plan prednisone burst and taper  Tobacco abuse restart Chantix program

## 2013-11-09 ENCOUNTER — Telehealth: Payer: Self-pay | Admitting: Family Medicine

## 2013-11-09 NOTE — Telephone Encounter (Signed)
Relevant patient education mailed to patient.  

## 2014-02-18 ENCOUNTER — Encounter: Payer: Self-pay | Admitting: Physician Assistant

## 2014-02-18 ENCOUNTER — Ambulatory Visit (INDEPENDENT_AMBULATORY_CARE_PROVIDER_SITE_OTHER): Payer: BC Managed Care – PPO | Admitting: Physician Assistant

## 2014-02-18 VITALS — BP 110/78 | HR 82 | Temp 98.6°F | Resp 18 | Wt 199.0 lb

## 2014-02-18 DIAGNOSIS — H6121 Impacted cerumen, right ear: Secondary | ICD-10-CM

## 2014-02-18 DIAGNOSIS — J209 Acute bronchitis, unspecified: Secondary | ICD-10-CM

## 2014-02-18 DIAGNOSIS — H6120 Impacted cerumen, unspecified ear: Secondary | ICD-10-CM

## 2014-02-18 MED ORDER — HYDROCODONE-HOMATROPINE 5-1.5 MG/5ML PO SYRP: 5.0000 mL | ORAL_SOLUTION | Freq: Three times a day (TID) | ORAL | Status: DC | PRN

## 2014-02-18 NOTE — Progress Notes (Signed)
Subjective:    Patient ID: Kevin Mcgee, male    DOB: July 04, 1971, 43 y.o.   MRN: [id number]  Cough This is a new problem. The current episode started in the past 7 days. The problem has been gradually improving. The problem occurs every few minutes. The cough is productive of sputum (greenish). Associated symptoms include ear congestion (right ear pressure, has had cerumen impaction in the past.), headaches, nasal congestion, postnasal drip and a sore throat (has been getting better). Pertinent negatives include no chest pain, chills, ear pain, fever, heartburn, hemoptysis, myalgias, rash, rhinorrhea, shortness of breath, sweats, weight loss or wheezing. Nothing aggravates the symptoms. Treatments tried: old cough syrup helped. The treatment provided moderate relief. His past medical history is significant for environmental allergies (takes flonase and zyrtec.). There is no history of asthma or COPD.      Review of Systems  Constitutional: Negative for fever, chills and weight loss.  HENT: Positive for postnasal drip and sore throat (has been getting better). Negative for ear pain and rhinorrhea.   Respiratory: Positive for cough. Negative for hemoptysis, shortness of breath and wheezing.   Cardiovascular: Negative for chest pain.  Gastrointestinal: Negative for heartburn, nausea, vomiting and diarrhea.  Musculoskeletal: Negative for myalgias.  Skin: Negative for rash.  Allergic/Immunologic: Positive for environmental allergies (takes flonase and zyrtec.).  Neurological: Positive for headaches.  All other systems reviewed and are negative.     Past Medical History  Diagnosis Date  . Sarcoidosis   . Allergy   . Asthma     History   Social History  . Marital Status: Single    Spouse Name: N/A    Number of Children: N/A  . Years of Education: N/A   Occupational History  . Not on file.   Social History Main Topics  . Smoking status: Current Some Day Smoker -- 0.25  packs/day    Types: Cigarettes  . Smokeless tobacco: Never Used  . Alcohol Use: Yes  . Drug Use: No  . Sexual Activity: Not on file   Other Topics Concern  . Not on file   Social History Narrative  . No narrative on file    History reviewed. No pertinent past surgical history.  Family History  Problem Relation Age of Onset  . Hypertension Other     No Known Allergies  Current Outpatient Prescriptions on File Prior to Visit  Medication Sig Dispense Refill  . cetirizine (ZYRTEC) 10 MG tablet Take 10 mg by mouth daily.        . fluticasone (FLONASE) 50 MCG/ACT nasal spray Place 2 sprays into the nose daily.  16 g  11  . Multiple Vitamin (MULTIVITAMIN) tablet Take 1 tablet by mouth daily.        Marland Kitchen. omeprazole (PRILOSEC) 20 MG capsule Take 20 mg by mouth daily.        . varenicline (CHANTIX CONTINUING MONTH PAK) 1 MG tablet One tablet daily in the morning  30 tablet  6   No current facility-administered medications on file prior to visit.    EXAM: BP 110/78  Pulse 82  Temp(Src) 98.6 F (37 C) (Oral)  Resp 18  Wt 199 lb (90.266 kg)  SpO2 98%      Objective:   Physical Exam  Nursing note and vitals reviewed. Constitutional: He is oriented to person, place, and time. He appears well-developed and well-nourished. No distress.  HENT:  Head: Normocephalic and atraumatic.  Nose: Nose normal.  Mouth/Throat: No oropharyngeal  exudate.  Cerumen impaction bilat ear canals. Oropharynx is slightly erythematous, no exudate. Bilateral TMs normal, after irrigation. Bilateral frontal and maxillary sinuses non-TTP.   Eyes: Conjunctivae and EOM are normal. Pupils are equal, round, and reactive to light.  Neck: Normal range of motion. Neck supple. No JVD present.  Cardiovascular: Normal rate, regular rhythm, normal heart sounds and intact distal pulses.   Pulmonary/Chest: Effort normal. No stridor. No respiratory distress. He has no wheezes. He has no rales. He exhibits no  tenderness.  Rhonchi bilateral lung bases.  Musculoskeletal: Normal range of motion.  Lymphadenopathy:    He has no cervical adenopathy.  Neurological: He is alert and oriented to person, place, and time.  Skin: Skin is warm and dry. No rash noted. He is not diaphoretic. No erythema. No pallor.  Psychiatric: He has a normal mood and affect. His behavior is normal. Judgment and thought content normal.     Lab Results  Component Value Date   WBC 6.3 03/31/2013   HGB 16.4 03/31/2013   HCT 49.9 03/31/2013   PLT 383.0 03/31/2013   GLUCOSE 91 03/31/2013   ALT 33 03/31/2013   AST 26 03/31/2013   NA 138 03/31/2013   K 4.2 03/31/2013   CL 104 03/31/2013   CREATININE 1.2 03/31/2013   BUN 17 03/31/2013   CO2 29 03/31/2013   TSH 0.86 03/31/2013           Assessment & Plan:  Kevin Mcgee was seen today for cough.  Diagnoses and associated orders for this visit:  Cerumen impaction, right Comments: Irrigation in office. Provided relief. Recomend pt use OTC wax softener and irrigation of both ears at home when symptoms develop. - Cancel: Ear Lavage - Ear Lavage  Acute bronchitis, unspecified organism Comments: Will use hycodan, mucinex, and push fluids. Pt will return if symptoms persist. - HYDROcodone-homatropine (HYCODAN) 5-1.5 MG/5ML syrup; Take 5 mLs by mouth every 8 (eight) hours as needed for cough.     Return precautions provided, and patient handout on cough.  Plan to follow up as needed, or for worsening or persistent symptoms despite treatment.  Patient Instructions  Over-the-counter ear wax softening drops when you start to feel your ears getting clogged.   Plain mucinex to help thin thickened secretions.  Hycodan cough syrup for cough as needed. DO NOT DRIVE WHILE TAKING THIS MEDICATION AS IT WILL INHIBIT YOUR ABILITY TO OPERATE A MOTOR VEHICLE.  If emergency symptoms discussed during visit developed, seek medical attention immediately.  Followup as needed, or for worsening or  persistent symptoms despite treatment.

## 2014-02-18 NOTE — Patient Instructions (Signed)
Over-the-counter ear wax softening drops when you start to feel your ears getting clogged.   Plain mucinex to help thin thickened secretions.  Hycodan cough syrup for cough as needed. DO NOT DRIVE WHILE TAKING THIS MEDICATION AS IT WILL INHIBIT YOUR ABILITY TO OPERATE A MOTOR VEHICLE.  If emergency symptoms discussed during visit developed, seek medical attention immediately.  Followup as needed, or for worsening or persistent symptoms despite treatment.   Cough, Adult  A cough is a reflex. It helps you clear your throat and airways. A cough can help heal your body. A cough can last 2 or 3 weeks (acute) or may last more than 8 weeks (chronic). Some common causes of a cough can include an infection, allergy, or a cold. HOME CARE  Only take medicine as told by your doctor.  If given, take your medicines (antibiotics) as told. Finish them even if you start to feel better.  Use a cold steam vaporizer or humidier in your home. This can help loosen thick spit (secretions).  Sleep so you are almost sitting up (semi-upright). Use pillows to do this. This helps reduce coughing.  Rest as needed.  Stop smoking if you smoke. GET HELP RIGHT AWAY IF:  You have yellowish-white fluid (pus) in your thick spit.  Your cough gets worse.  Your medicine does not reduce coughing, and you are losing sleep.  You cough up blood.  You have trouble breathing.  Your pain gets worse and medicine does not help.  You have a fever. MAKE SURE YOU:   Understand these instructions.  Will watch your condition.  Will get help right away if you are not doing well or get worse. Document Released: 05/02/2011 Document Revised: 11/11/2011 Document Reviewed: 05/02/2011 [facility] Patient Information 2015 [place], [place]. This information is not intended to replace advice given to you by your health care provider. Make sure you discuss any questions you have with your health care provider.

## 2014-02-18 NOTE — Progress Notes (Signed)
Pre visit review using our clinic review tool, if applicable. No additional management support is needed unless otherwise documented below in the visit note. 

## 2014-03-25 ENCOUNTER — Encounter: Payer: Self-pay | Admitting: Physician Assistant

## 2014-03-25 ENCOUNTER — Ambulatory Visit (INDEPENDENT_AMBULATORY_CARE_PROVIDER_SITE_OTHER): Payer: BC Managed Care – PPO | Admitting: Physician Assistant

## 2014-03-25 ENCOUNTER — Ambulatory Visit (INDEPENDENT_AMBULATORY_CARE_PROVIDER_SITE_OTHER)
Admission: RE | Admit: 2014-03-25 | Discharge: 2014-03-25 | Disposition: A | Discharge status: Home / Self Care | Payer: BC Managed Care – PPO | Source: Ambulatory Visit | Attending: Physician Assistant | Admitting: Physician Assistant

## 2014-03-25 VITALS — BP 100/68 | HR 72 | Temp 98.7°F | Resp 18 | Wt 198.0 lb

## 2014-03-25 DIAGNOSIS — M79609 Pain in unspecified limb: Secondary | ICD-10-CM

## 2014-03-25 DIAGNOSIS — H00016 Hordeolum externum left eye, unspecified eyelid: Secondary | ICD-10-CM

## 2014-03-25 DIAGNOSIS — M79644 Pain in right finger(s): Secondary | ICD-10-CM

## 2014-03-25 DIAGNOSIS — H00019 Hordeolum externum unspecified eye, unspecified eyelid: Secondary | ICD-10-CM

## 2014-03-25 DIAGNOSIS — H04552 Acquired stenosis of left nasolacrimal duct: Secondary | ICD-10-CM

## 2014-03-25 DIAGNOSIS — H04559 Acquired stenosis of unspecified nasolacrimal duct: Secondary | ICD-10-CM

## 2014-03-25 MED ORDER — POLYMYXIN B-TRIMETHOPRIM 10000-0.1 UNIT/ML-% OP SOLN: OPHTHALMIC | Status: DC

## 2014-03-25 NOTE — Progress Notes (Signed)
Subjective:    Patient ID: Kevin Mcgee, male    DOB: 05-Oct-1970, 43 y.o.   MRN: [id number]  HPI Patient is a 43 y.o. male presenting for blocked tear duct, and thumb pain. Tear duct- Had history of this about a year ago, used tobradex eye drops, which relieved symptoms. Current episode started about a week ago. He noticed itching and redness, states that he used some of the left over eye drops. Believes this may have helped a little, but wanted to make sure. States he still feels a little irritation, and thought he may have seen some redness on the lower eyelid. Does not remember any foreign body. Vision has stayed the same, no blurring or double vision. States his inner eyelid was a little puffy, but this mostly resolved. Patient denies fevers, chills, nausea, vomiting, diarrhea, shortness of breath, chest pain, headache, syncope.  Thumb pain- Present for about a 2 weeks. Doesn't remember injuring his thumb or hand. No falls. The pain is located on the dorsal aspect of the right 1st mcp. Says that it mostly hurts when he is using it, which causes a sharp pain, moderate pain causing him to relax his grip. At rest it is only tender when palpated. No loss in range of motion.No radiating pain, numbness or tingling. Tried some ice once, which didn't offer relief.    Review of Systems As per HPI and are otherwise negative.   Past Medical History  Diagnosis Date  . Sarcoidosis   . Allergy   . Asthma     History   Social History  . Marital Status: Single    Spouse Name: N/A    Number of Children: N/A  . Years of Education: N/A   Occupational History  . Not on file.   Social History Main Topics  . Smoking status: Current Some Day Smoker -- 0.25 packs/day    Types: Cigarettes  . Smokeless tobacco: Never Used  . Alcohol Use: Yes  . Drug Use: No  . Sexual Activity: Not on file   Other Topics Concern  . Not on file   Social History Narrative  . No narrative on file     History reviewed. No pertinent past surgical history.  Family History  Problem Relation Age of Onset  . Hypertension Other     No Known Allergies  Current Outpatient Prescriptions on File Prior to Visit  Medication Sig Dispense Refill  . cetirizine (ZYRTEC) 10 MG tablet Take 10 mg by mouth daily.        . fluticasone (FLONASE) 50 MCG/ACT nasal spray Place 2 sprays into the nose daily.  16 g  11  . HYDROcodone-homatropine (HYCODAN) 5-1.5 MG/5ML syrup Take 5 mLs by mouth every 8 (eight) hours as needed for cough.  120 mL  0  . Multiple Vitamin (MULTIVITAMIN) tablet Take 1 tablet by mouth daily.        Marland Kitchen. omeprazole (PRILOSEC) 20 MG capsule Take 20 mg by mouth daily.        . varenicline (CHANTIX CONTINUING MONTH PAK) 1 MG tablet One tablet daily in the morning  30 tablet  6   No current facility-administered medications on file prior to visit.   The PFS history was reviewed at time of visit.   EXAM: BP 100/68  Pulse 72  Temp(Src) 98.7 F (37.1 C) (Oral)  Resp 18  Wt 198 lb (89.812 kg)     Objective:   Physical Exam  Nursing note and vitals reviewed.  Constitutional: He is oriented to person, place, and time. He appears well-developed and well-nourished. No distress.  HENT:  Head: Normocephalic and atraumatic.  Eyes: Conjunctivae and EOM are normal. Pupils are equal, round, and reactive to light. Right eye exhibits no discharge. Left eye exhibits discharge.  Mild clear discharge from left eye without matting. Vision grossly intact. No periorbital edema. Conjunctiva normal. Mild blepharitis medial left eye. Stye on the left lower lid.  Neck: Normal range of motion. Neck supple.  Cardiovascular: Normal rate, regular rhythm, normal heart sounds and intact distal pulses.   Pulmonary/Chest: Effort normal and breath sounds normal. No respiratory distress. He exhibits no tenderness.  Musculoskeletal: Normal range of motion. He exhibits tenderness (mild ttp of the right anatomical  snuffbox.). He exhibits no edema.  Neurological: He is alert and oriented to person, place, and time.  Skin: Skin is warm and dry. No rash noted. He is not diaphoretic. No erythema. No pallor.  Psychiatric: He has a normal mood and affect. His behavior is normal. Judgment and thought content normal.    Lab Results  Component Value Date   WBC 6.3 03/31/2013   HGB 16.4 03/31/2013   HCT 49.9 03/31/2013   PLT 383.0 03/31/2013   GLUCOSE 91 03/31/2013   ALT 33 03/31/2013   AST 26 03/31/2013   NA 138 03/31/2013   K 4.2 03/31/2013   CL 104 03/31/2013   CREATININE 1.2 03/31/2013   BUN 17 03/31/2013   CO2 29 03/31/2013   TSH 0.86 03/31/2013         Assessment & Plan:  Kevin Mcgee was seen today for left blocked tear duct and right hand (thumb) pain.  Diagnoses and associated orders for this visit:  Stye, left Comments: Will have pt do warm compressess.   Blocked tear duct, left Comments: Maybe improving, as noticed some crusting, will provided abx drops. also use warm compresses and gentle massage. - trimethoprim-polymyxin b (POLYTRIM) ophthalmic solution; 1 to 2 drops per affected eye 4 times daily for 5 days.  Thumb pain, right Comments: Anatomical snuff-box ttp? No known injury mechanism. Will obtain xray. RICE therapy. - DG Hand Complete Right; Future    Pt with anatomical snuff box tenderness, no known injury mechanism. Will Xray and adjust plan as needed.   Polytrim drops to help prevent worsening conjunctivitis with apparent blocked tearduct. Also add warm compresses and gentle massage.  Return precautions provided, and patient handout on stye, RICE therapy.  Plan to follow up as needed, or for worsening or persistent symptoms despite treatment.  Patient Instructions  Continue to use warm moist compresses on the left eye to help with both the stye and potentially blocked tear duct. Also use gentle massage over the tear duct to help it resolve.   Stop using the Tobradex, as it may  potentially hurt your condition since we are unsure of the cause.   A much safer drop is Polytrim, which we have sent to your pharmacy. Directions are 1 to 2 drops per effected eye for 5 days, complete a full five days to prevent rebound infection.  You will have an xray of your hand done today at the [place] office, we will call with the results when available.  RICE therapy for your thumb as directed, you may also try Over the counter antiinflammatory to help pain.   If emergency symptoms discussed during visit developed, seek medical attention immediately.  Followup as needed, or for worsening or persistent symptoms despite treatment.

## 2014-03-25 NOTE — Patient Instructions (Addendum)
Continue to use warm moist compresses on the left eye to help with both the stye and potentially blocked tear duct. Also use gentle massage over the tear duct to help it resolve.   Stop using the Tobradex, as it may potentially hurt your condition since we are unsure of the cause.   A much safer drop is Polytrim, which we have sent to your pharmacy. Directions are 1 to 2 drops per effected eye for 5 days, complete a full five days to prevent rebound infection.  You will have an xray of your hand done today at the [place] office, we will call with the results when available.  RICE therapy for your thumb as directed, you may also try Over the counter antiinflammatory to help pain.   If emergency symptoms discussed during visit developed, seek medical attention immediately.  Followup as needed, or for worsening or persistent symptoms despite treatment.    Sty A sty (hordeolum) is an infection of a gland in the eyelid located at the base of the eyelash. A sty may develop a white or yellow head of pus. It can be puffy (swollen). Usually, the sty will burst and pus will come out on its own. They do not leave lumps in the eyelid once they drain. A sty is often confused with another form of cyst of the eyelid called a chalazion. Chalazions occur within the eyelid and not on the edge where the bases of the eyelashes are. They often are red, sore and then form firm lumps in the eyelid. CAUSES   Germs (bacteria).  Lasting (chronic) eyelid inflammation. SYMPTOMS   Tenderness, redness and swelling along the edge of the eyelid at the base of the eyelashes.  Sometimes, there is a white or yellow head of pus. It may or may not drain. DIAGNOSIS  An ophthalmologist will be able to distinguish between a sty and a chalazion and treat the condition appropriately.  TREATMENT   Styes are typically treated with warm packs (compresses) until drainage occurs.  In rare cases, medicines that kill germs  (antibiotics) may be prescribed. These antibiotics may be in the form of drops, cream or pills.  If a hard lump has formed, it is generally necessary to do a small incision and remove the hardened contents of the cyst in a minor surgical procedure done in the office.  In suspicious cases, your caregiver may send the contents of the cyst to the lab to be certain that it is not a rare, but dangerous form of cancer of the glands of the eyelid. HOME CARE INSTRUCTIONS   Wash your hands often and dry them with a clean towel. Avoid touching your eyelid. This may spread the infection to other parts of the eye.  Apply heat to your eyelid for 10 to 20 minutes, several times a day, to ease pain and help to heal it faster.  Do not squeeze the sty. Allow it to drain on its own. Wash your eyelid carefully 3 to 4 times per day to remove any pus. SEEK IMMEDIATE MEDICAL CARE IF:   Your eye becomes painful or puffy (swollen).  Your vision changes.  Your sty does not drain by itself within 3 days.  Your sty comes back within a short period of time, even with treatment.  You have redness (inflammation) around the eye.  You have a fever. Document Released: 05/29/2005 Document Revised: 11/11/2011 Document Reviewed: 12/03/2013 [facility] Patient Information 2015 [place], [place]. This information is not intended to replace  advice given to you by your health care provider. Make sure you discuss any questions you have with your health care provider. RICE: Routine Care for Injuries Rest, Ice, Compression, and Elevation (RICE) are often used to care for injuries. HOME CARE  Rest your injury.  Put ice on the injury.  Put ice in a plastic bag.  Place a towel between your skin and the bag.  Leave the ice on for 15-20 minutes, 03-04 times a day. Do this for as long as told by your doctor.  Apply pressure (compression) with an elastic bandage. Remove and reapply the bandage every 3 to 4 hours. Do not wrap the  bandage too tight. Wrap the bandage looser if the fingers or toes are puffy (swollen), blue, cold, painful, or lose feeling (numb).  Raise (elevate) your injury. Raise your injury above the heart if you can. GET HELP RIGHT AWAY IF:  You have lasting pain or puffiness.  Your injury is red, weak, or loses feeling.  Your problems get worse, not better, after several days. MAKE SURE YOU:  Understand these instructions.  Will watch your condition.  Will get help right away if you are not doing well or get worse. Document Released: 02/05/2008 Document Revised: 11/11/2011 Document Reviewed: 01/18/2011 [facility] Patient Information 2015 [place], [place]. This information is not intended to replace advice given to you by your health care provider. Make sure you discuss any questions you have with your health care provider.

## 2014-03-25 NOTE — Progress Notes (Signed)
Pre visit review using our clinic review tool, if applicable. No additional management support is needed unless otherwise documented below in the visit note. 

## 2014-05-03 ENCOUNTER — Ambulatory Visit (INDEPENDENT_AMBULATORY_CARE_PROVIDER_SITE_OTHER): Payer: BC Managed Care – PPO | Admitting: Family Medicine

## 2014-05-03 ENCOUNTER — Encounter: Payer: Self-pay | Admitting: Family Medicine

## 2014-05-03 VITALS — BP 120/80 | Temp 98.1°F | Wt 202.0 lb

## 2014-05-03 DIAGNOSIS — F17200 Nicotine dependence, unspecified, uncomplicated: Secondary | ICD-10-CM

## 2014-05-03 DIAGNOSIS — M778 Other enthesopathies, not elsewhere classified: Secondary | ICD-10-CM | POA: Insufficient documentation

## 2014-05-03 DIAGNOSIS — M779 Enthesopathy, unspecified: Secondary | ICD-10-CM

## 2014-05-03 DIAGNOSIS — M65839 Other synovitis and tenosynovitis, unspecified forearm: Secondary | ICD-10-CM

## 2014-05-03 DIAGNOSIS — M65849 Other synovitis and tenosynovitis, unspecified hand: Secondary | ICD-10-CM

## 2014-05-03 DIAGNOSIS — Z23 Encounter for immunization: Secondary | ICD-10-CM

## 2014-05-03 MED ORDER — VARENICLINE TARTRATE 1 MG PO TABS: ORAL_TABLET | ORAL | Status: DC

## 2014-05-03 MED ORDER — INDOMETHACIN 50 MG PO CAPS: 50.0000 mg | ORAL_CAPSULE | Freq: Two times a day (BID) | ORAL | Status: DC

## 2014-05-03 NOTE — Progress Notes (Signed)
Pre visit review using our clinic review tool, if applicable. No additional management support is needed unless otherwise documented below in the visit note. 

## 2014-05-03 NOTE — Patient Instructions (Signed)
Splints each bedtime  Elevation ice 15 minutes prior to bedtime  Indomethacin 50 mg........... one twice daily with food  Chantix 1 mg............ one half tab daily in the morning for 2 months and taper by one per week

## 2014-05-03 NOTE — Progress Notes (Signed)
   Subjective:    Patient ID: Kevin Mcgee, male    DOB: 28-Feb-1971, 43 y.o.   MRN: [id number]  HPI  Kevin Mcgee is a 43 year old male who comes in today for evaluation of 2 problems  He's got tendinitis in his right and left thumb now for the past 2 months. He's tried occasional when necessary Motrin but it hasn't helped. He does have some splints at home because his had this problem the past  He's off the Chantix then the 3-4 cigarettes a day. I would like him to quit completely  Review of Systems Review of systems otherwise negative    Objective:   Physical Exam Well-developed and nourished male no acute distress vital signs stable he is afebrile examination the hand is normal except for some tenderness along the extensor tendon both sounds       Assessment & Plan:  Bilateral tendinitis........... Splinting..... Ice........ Indocin 50 twice a day  Tobacco abuse.......Marland Kitchen restart Chantix program.

## 2014-06-09 ENCOUNTER — Ambulatory Visit (INDEPENDENT_AMBULATORY_CARE_PROVIDER_SITE_OTHER): Payer: BC Managed Care – PPO | Admitting: Family Medicine

## 2014-06-09 ENCOUNTER — Encounter: Payer: Self-pay | Admitting: Family Medicine

## 2014-06-09 VITALS — BP 124/74 | Temp 98.1°F | Ht 74.5 in | Wt 205.6 lb

## 2014-06-09 DIAGNOSIS — L259 Unspecified contact dermatitis, unspecified cause: Secondary | ICD-10-CM

## 2014-06-09 NOTE — Progress Notes (Signed)
Pre visit review using our clinic review tool, if applicable. No additional management support is needed unless otherwise documented below in the visit note. 

## 2014-06-09 NOTE — Progress Notes (Signed)
   Subjective:    Patient ID: Kevin Mcgee, male    DOB: 1971-04-07, 43 y.o.   MRN: [id number]  HPI Kevin Mcgee is a 43 year old male who comes in today for evaluation of a skin rash.  He has a round red raised. Ache lesion on his left posterior calf that he thought was a fungal infection. He's been putting antifungal medicine is not helping.  He has a history of allergic rhinitis and asthma   Review of Systems Negative    Objective:   Physical Exam  Well-developed well-nourished male no acute distress vital signs stable he is afebrile examination skin shows a silver dollar sized round excoriated lesion red. Consistent with a dyshidrotic eczema type lesion      Assessment & Plan:  Dyshidrotic eczema............ triamcinolone small amounts twice daily.

## 2014-06-09 NOTE — Patient Instructions (Signed)
Apply triamcinolone small amounts twice daily  Return when necessary

## 2014-06-10 ENCOUNTER — Telehealth: Payer: Self-pay | Admitting: Family Medicine

## 2014-06-10 NOTE — Telephone Encounter (Signed)
emmi emailed °

## 2014-07-19 ENCOUNTER — Ambulatory Visit (INDEPENDENT_AMBULATORY_CARE_PROVIDER_SITE_OTHER): Payer: BC Managed Care – PPO | Admitting: Family Medicine

## 2014-07-19 ENCOUNTER — Encounter: Payer: Self-pay | Admitting: Family Medicine

## 2014-07-19 VITALS — BP 120/80 | Temp 98.1°F | Wt 205.0 lb

## 2014-07-19 DIAGNOSIS — L240 Irritant contact dermatitis due to detergents: Secondary | ICD-10-CM | POA: Insufficient documentation

## 2014-07-19 MED ORDER — TRIAMCINOLONE ACETONIDE 0.025 % EX OINT: 1.0000 "application " | TOPICAL_OINTMENT | Freq: Two times a day (BID) | CUTANEOUS | Status: DC

## 2014-07-19 NOTE — Progress Notes (Signed)
   Subjective:    Patient ID: Kevin Mcgee, male    DOB: 11/26/1970, 43 y.o.   MRN: [id number]  HPI Kevin Mcgee is a 43 year old male single,,,,,, engaged,,,,, who comes in today for evaluation of irritation of the shaft of his penis He states that he and his fiance have sex he'll sometimes get some erythema around the shaft of the penis. The other day try to condom which made it worse.  No history of STDs   Review of Systems    review of systems otherwise negative Objective:   Physical Exam  Well-developed well-nourished male no acute distress vital signs stable he is afebrile examination genitalia.............. Normal circumcised male. There is some erythema around the distal third of the shaft of the penis.      Assessment & Plan:  Contact dermatitis......Marland Kitchen. Maybe latex sensitivity...Marland Kitchen.Marland Kitchen.Marland Kitchen. Avoid latex.......... Triamcinolone gel twice a day when necessary

## 2014-07-19 NOTE — Patient Instructions (Signed)
Triamcinolone gel..........Marland Kitchen. Apply small amounts twice daily when necessary

## 2014-09-09 ENCOUNTER — Encounter: Payer: Self-pay | Admitting: Family Medicine

## 2014-09-09 ENCOUNTER — Ambulatory Visit (INDEPENDENT_AMBULATORY_CARE_PROVIDER_SITE_OTHER): Payer: BLUE CROSS/BLUE SHIELD | Admitting: Family Medicine

## 2014-09-09 VITALS — BP 120/80 | HR 90 | Temp 97.9°F | Wt 203.0 lb

## 2014-09-09 DIAGNOSIS — J209 Acute bronchitis, unspecified: Secondary | ICD-10-CM

## 2014-09-09 MED ORDER — BENZONATATE 200 MG PO CAPS: 200.0000 mg | ORAL_CAPSULE | Freq: Three times a day (TID) | ORAL | Status: DC | PRN

## 2014-09-09 MED ORDER — FLUTICASONE PROPIONATE 50 MCG/ACT NA SUSP: 2.0000 | Freq: Every day | NASAL | Status: DC

## 2014-09-09 MED ORDER — OMEPRAZOLE 20 MG PO CPDR: 20.0000 mg | DELAYED_RELEASE_CAPSULE | Freq: Every day | ORAL | Status: DC

## 2014-09-09 NOTE — Progress Notes (Signed)
Pre visit review using our clinic review tool, if applicable. No additional management support is needed unless otherwise documented below in the visit note. 

## 2014-09-09 NOTE — Progress Notes (Signed)
   Subjective:    Patient ID: Kevin Mcgee, male    DOB: 1971-05-10, 44 y.o.   MRN: [id number]  HPI Patient seen for acute visit. Onset of cough around the summer 23rd. His ex-smoker. He went to urgent care on the 26th and prescribed Hycodan cough syrup which helped somewhat with his nighttime cough. Never had any fever. No dyspnea. No significant nasal congestion. No hemoptysis. Using over-the-counter Mucinex. Cough fairly well controlled with Hycodan. He is having some daytime cough  Past Medical History  Diagnosis Date  . Sarcoidosis   . Allergy   . Asthma    No past surgical history on file.  reports that he has quit smoking. His smoking use included Cigarettes. He smoked 0.25 packs per day. He has never used smokeless tobacco. He reports that he drinks alcohol. He reports that he does not use illicit drugs. family history includes Hypertension in his other. No Known Allergies    Review of Systems  Constitutional: Negative for fever, chills, appetite change and unexpected weight change.  HENT: Negative for congestion.   Respiratory: Positive for cough. Negative for shortness of breath and wheezing.        Objective:   Physical Exam  Constitutional: He appears well-developed and well-nourished.  HENT:  Right Ear: External ear normal.  Left Ear: External ear normal.  Mouth/Throat: Oropharynx is clear and moist.  Neck: Neck supple.  Cardiovascular: Normal rate and regular rhythm.   Pulmonary/Chest: Effort normal and breath sounds normal. No respiratory distress. He has no wheezes. He has no rales.  Lymphadenopathy:    He has no cervical adenopathy.          Assessment & Plan:  Cough. Suspect resolving viral bronchitis. Tessalon Perles 200 mg every 8 hours as needed for daytime cough. Continue Hycodan at night as needed. Follow-up promptly for any fever or change of symptoms

## 2014-09-09 NOTE — Patient Instructions (Signed)

## 2014-09-12 ENCOUNTER — Telehealth: Payer: Self-pay | Admitting: Family Medicine

## 2014-09-12 MED ORDER — HYDROCODONE-HOMATROPINE 5-1.5 MG/5ML PO SYRP: 5.0000 mL | ORAL_SOLUTION | Freq: Three times a day (TID) | ORAL | Status: DC | PRN

## 2014-09-12 NOTE — Telephone Encounter (Signed)
Pt saw dr Caryl Neverburchette on 09-09-14 and  Was prescribed tessalon 200 mg. Pt states med is not working would like a new rx hydrocodone cough syrup

## 2014-09-12 NOTE — Telephone Encounter (Signed)
Okay per Dr Todd.  Rx ready for pick up and patient is aware. 

## 2014-09-28 ENCOUNTER — Telehealth: Payer: Self-pay | Admitting: Family Medicine

## 2014-09-28 NOTE — Telephone Encounter (Signed)
FYI

## 2014-09-28 NOTE — Telephone Encounter (Signed)
Patient Name: Kevin KnappRONALD Mcgee  DOB: Apr 18, 1971    Initial Comment Caller states he has a cough, started out as a cold before [place]. Gets a headache when he coughs.   Nurse Assessment  Nurse: Scarlette ArStandifer, RN, Heather Date/Time (Eastern Time): 09/28/2014 10:36:15 AM  Confirm and document reason for call. If symptomatic, describe symptoms. ---Caller states he has a cough, started out as a cold before [place]. [facility] the office since the cough started. Gets a headache when he coughs.  Has the patient traveled out of the country within the last 30 days? ---Not Applicable  Does the patient require triage? ---Yes  Related visit to physician within the last 2 weeks? ---Yes  Does the PT have any chronic conditions? (i.e. diabetes, asthma, etc.) ---Yes  List chronic conditions. ---pulmonary sarcoidosis     Guidelines    Guideline Title Affirmed Question Affirmed Notes  Cough - Acute Productive SEVERE coughing spells (e.g., whooping sound after coughing, vomiting after coughing)    Final Disposition User   See Physician within 24 Hours Standifer, Charity fundraiserN, Avery DennisonHeather

## 2014-09-29 ENCOUNTER — Encounter: Payer: Self-pay | Admitting: Family Medicine

## 2014-09-29 ENCOUNTER — Ambulatory Visit (INDEPENDENT_AMBULATORY_CARE_PROVIDER_SITE_OTHER): Payer: BLUE CROSS/BLUE SHIELD | Admitting: Family Medicine

## 2014-09-29 VITALS — BP 130/80 | Temp 97.9°F | Wt 205.0 lb

## 2014-09-29 DIAGNOSIS — R05 Cough: Secondary | ICD-10-CM

## 2014-09-29 DIAGNOSIS — R059 Cough, unspecified: Secondary | ICD-10-CM

## 2014-09-29 MED ORDER — HYDROCODONE-HOMATROPINE 5-1.5 MG/5ML PO SYRP: 5.0000 mL | ORAL_SOLUTION | Freq: Three times a day (TID) | ORAL | Status: DC | PRN

## 2014-09-29 MED ORDER — PREDNISONE 20 MG PO TABS: ORAL_TABLET | ORAL | Status: DC

## 2014-09-29 NOTE — Progress Notes (Signed)
Pre visit review using our clinic review tool, if applicable. No additional management support is needed unless otherwise documented below in the visit note. 

## 2014-09-29 NOTE — Progress Notes (Signed)
   Subjective:    Patient ID: Kevin Mcgee, male    DOB: 05/03/1971, 44 y.o.   MRN: [id number]  HPI Kevin Mcgee is a 44 year old male X smoker 3 weeks who has a history of underlying sarcoidosis who comes in today for cough for a month  The cough started around Christmas time. He went to an urgent care and got some cough syrup exam was normal. Appropriately no antibiotics were given.  He then came in here about 10 days ago on a Friday and saw Dr. Caryl NeverBurchette. Examination at that time again was normal he was refilled his cough syrup again antibiotics were not given because her not indicated  He's had no fevers shortness of breath etc.   Review of Systems Review of systems negative    Objective:   Physical Exam  Well-developed well-nourished male no acute distress vital signs stable he is afebrile HEENT were negative neck was supple no adenopathy he has a small polyp on the left side of his tongue.  Chest exam shows symmetrical breath sounds very late mild expiratory wheezing on forced expiration      Assessment & Plan:  Viral syndrome with reactive airway disease secondary to underlying chronic tobacco abuse,,,,,,,, continue to refrain from smoking prednisone burst and taper

## 2014-09-29 NOTE — Patient Instructions (Signed)
Prednisone 20 mg........ 2 tabs daily until you feel significantly better then taper as outlined  Hydromet.......Marland Kitchen. 1/2-1 teaspoon at bedtime when necessary  Vaporizer  Congratulations on being a nonsmoker......... continue the good job

## 2015-02-21 ENCOUNTER — Encounter: Payer: Self-pay | Admitting: Family Medicine

## 2015-02-21 ENCOUNTER — Ambulatory Visit (INDEPENDENT_AMBULATORY_CARE_PROVIDER_SITE_OTHER): Payer: BLUE CROSS/BLUE SHIELD | Admitting: Family Medicine

## 2015-02-21 VITALS — BP 120/80 | HR 95 | Temp 98.1°F | Wt 198.0 lb

## 2015-02-21 DIAGNOSIS — R1030 Lower abdominal pain, unspecified: Secondary | ICD-10-CM

## 2015-02-21 DIAGNOSIS — Z7184 Encounter for health counseling related to travel: Secondary | ICD-10-CM

## 2015-02-21 DIAGNOSIS — M778 Other enthesopathies, not elsewhere classified: Secondary | ICD-10-CM | POA: Diagnosis not present

## 2015-02-21 DIAGNOSIS — M779 Enthesopathy, unspecified: Principal | ICD-10-CM

## 2015-02-21 DIAGNOSIS — Z7189 Other specified counseling: Secondary | ICD-10-CM

## 2015-02-21 DIAGNOSIS — R1032 Left lower quadrant pain: Secondary | ICD-10-CM

## 2015-02-21 MED ORDER — DICLOFENAC SODIUM 1 % TD GEL: 2.0000 g | Freq: Four times a day (QID) | TRANSDERMAL | Status: DC

## 2015-02-21 MED ORDER — FLUTICASONE PROPIONATE 50 MCG/ACT NA SUSP: 2.0000 | Freq: Every day | NASAL | Status: DC

## 2015-02-21 NOTE — Patient Instructions (Signed)
Triceps Tendinitis with Rehab Triceps tendinitis usually results in a ligament sprain (tear). The triceps tendon attaches the elbow to the triceps muscle on the back of the arm. It prevents the elbow from bending too far outward. Sprains are classified into three categories. Grade 1 sprains cause pain, but the tendon is not lengthened. Grade 2 sprains include a lengthened ligament due to the ligament being stretched or partially ruptured. With grade 2 sprains there is still function, although the function may be diminished. Grade 3 sprains are characterized by a complete tear of the tendon or muscle and function is usually impaired. SYMPTOMS   Pain, tenderness, swelling, and/or bruising over the site of injury (contusion).  Pain that worsens with elbow movement, such as push-ups.  A "pop" or tear felt or heard at the time of injury.  Decreased elbow function and/or grip strength. CAUSES   Overuse of triceps muscles and tendons.  Injury, laceration or direct blow to the triceps tendon. RISK INCREASES WITH:  Activities in which falling is likely.  Weightlifting and push-ups.  Poor strength and flexibility.  Steroid use. PREVENTION  Warm up and stretch properly before activity.  Maintain physical fitness:  Strength, flexibility, and endurance.  Cardiovascular fitness.  Learn and use proper technique. When possible, have coach correct improper technique.  Functional braces may be effective in preventing injury, especially re-injury, in contact sports. PROGNOSIS  Triceps sprains usually heal in 6 weeks with proper treatment and rest. RELATED COMPLICATIONS  Tendon rupture requiring surgery.  Loss of motion in the elbow  Prolonged healing time, if improperly treated or re-injured. TREATMENT  Treatment initially involves resting from any activities that aggravate the symptoms, and the use of ice and medications to help reduce pain and inflammation. Referral to a therapist for  further evaluation and treatment may be enough for a full recovery. If rehabilitation alone is insufficient for resolving the injury, then surgery may be necessary to use other tissue to recreate (reconstruct) the torn tendon. After surgery, immobilization of the elbow is necessary to allow for healing. After immobilization it is important to perform strengthening and stretching exercises to help regain strength and a full range of motion. These exercises may be completed at home or with a therapist. Your therapist will decide when you may return to sports. MEDICATION   If pain medication is necessary, then nonsteroidal anti-inflammatory medications, such as aspirin and ibuprofen, or other minor pain relievers, such as acetaminophen, are often recommended.  Do not take pain medication for 7 days before surgery.  Prescription pain relievers may be given if deemed necessary by your caregiver. Use only as directed and only as much as you need. HEAT AND COLD  Cold treatment (icing) relieves pain and reduces inflammation. Cold treatment should be applied for 10 to 15 minutes every 2 to 3 hours for inflammation and pain and immediately after any activity that aggravates your symptoms. Use ice packs or massage the area with a piece of ice (ice massage).  Heat treatment may be used prior to performing the stretching and strengthening activities prescribed by your caregiver, physical therapist, or athletic trainer. Use a heat pack or soak your injury in warm water. SEEK MEDICAL CARE IF:  Treatment seems to offer no benefit, or the condition worsens.  Any medications produce adverse side effects.  Any complications from surgery occur:  Pain, numbness, or coldness in the extremity operated upon.  Discoloration of the nail beds (they become blue or gray) of the extremity operated upon.  Signs of infections (fever, pain, inflammation, redness, or persistent bleeding). EXERCISES RANGE OF MOTION (ROM)  AND STRETCHING EXERCISES - Triceps Tendinitis These exercises may help you when beginning to rehabilitate your injury. Your symptoms may resolve with or without further involvement from your physician, physical therapist or athletic trainer. While completing these exercises, remember:   Restoring tissue flexibility helps normal motion to return to the joints. This allows healthier, less painful movement and activity.  An effective stretch should be held for at least 30 seconds.  A stretch should never be painful. You should only feel a gentle lengthening or release in the stretched tissue. RANGE OF MOTION - Flexion  Hold your right / left arm at your side and bend your elbow as far as you can using your right / left arm muscles.  Bend the right / left elbow farther by gently pushing up on your forearm until you feel a gentle stretch on the outside of your elbow. Hold this position for __________ seconds.  Slowly return to the starting position. Repeat __________ times. Complete this exercise __________ times per day.  RANGE OF MOTION - Elbow Flexion, Supine   Lie on your back. Extend your right / left arm into the air, bracing it with your opposite hand. Allow your right / left arm to relax.  Let your elbow bend, allowing your hand fall slowly toward your chest.  You should feel a gentle stretch along the back of your upper arm and/or elbow. Your physician, physical therapist or athletic trainer may ask you to hold a __________ hand weight to increase the intensity of this stretch.  Hold for __________ seconds. Slowly return your right / left arm to the upright position. Repeat __________ times. Complete this exercise __________ times per day. STRETCH - Elbow Flexors   Lie on a firm bed or countertop on your back. Be sure that you are in a comfortable position which will allow you to relax your arm muscles.  Place a folded towel under your upper arm so that your elbow and shoulder are  at the same height. Extend your arm; your elbow should not rest on the bed or towel  Allow the weight of your hand to straighten your elbow. Keep your arm and chest muscles relaxed. Your caretaker may ask you to increase the intensity of your stretch by adding a small wrist or hand weight.  Hold for __________ seconds. You should feel a stretch on the inside of your elbow. Slowly return to the starting position. Repeat __________ times. Complete this exercise __________ times per day. STRENGTHENING EXERCISES - Triceps Tendinitis These exercises will help you regain your strength. These exercises may resolve your symptoms with or without further involvement from your physician, physical therapist or athletic trainer. While completing these exercises, remember:   Muscles can gain both the endurance and the strength needed for everyday activities through controlled exercises.  Complete these exercises as instructed by your physician, physical therapist or athletic trainer. Progress with the resistance and repetition exercises only as your caregiver advises.  You may experience muscle soreness or fatigue, but the pain or discomfort you are trying to eliminate should never worsen during these exercises. If this pain does worsen, stop and make certain you are following the directions exactly. If the pain is still present after adjustments, discontinue the exercise until you can discuss the trouble with your clinician. STRENGTH - Elbow Extensors, Isometric  Stand or sit upright on a firm surface. Place your right /  left arm so that your palm faces your abdomen and it is at the height of your waist.  Place your opposite hand on the underside of your forearm. Gently push up as your right / left arm resists. Push as hard as you can with both arms without causing any pain or movement at your right / left elbow. Hold this stationary position for __________ seconds.  Gradually release the tension in both  arms. Allow your muscles to relax completely before repeating. Repeat __________ times. Complete this exercise __________ times per day. STRENGTH - Elbow Flexors, Supinated  With good posture, stand or sit on a firm chair without armrests. Allow your right / left arm to rest at your side with your palm facing forward.  Holding a __________ weight or gripping a rubber exercise band/tubing, bring your hand toward your shoulder.  Allow your muscles to control the resistance as your hand returns to your side. Repeat __________ times. Complete this exercise __________ times per day.  STRENGTH - Elbow Flexors, Neutral  With good posture, stand or sit on a firm chair without armrests. Allow your right / left arm to rest at your side with your thumb facing forward.  Holding a __________ weight or gripping a rubber exercise band/tubing, bring your hand toward your shoulder.  Allow your muscles to control the resistance as your hand returns to your side. Repeat __________ times. Complete this exercise __________ times per day.  STRENGTH - Elbow Extensors  Lie on your back. Extend your right / left elbow into the air, pointing it toward the ceiling. Brace your arm with your opposite hand.*  Holding a __________ weight in your hand, slowly straighten your right / left elbow.  Allow your muscles to control the weight as your hand returns to its starting position. Repeat __________ times. Complete this exercise __________ times per day. *You may also stand with your elbow overhead and pointed toward the ceiling and supported by your opposite hand. STRENGTH - Elbow Extensors, Dynamic  With good posture, stand or sit on a firm chair without armrests. Keeping your upper arms at your side, bring both hands up to your right / left shoulder while gripping a rubber exercise band/tubing. Your right / left hand should be just below the other hand.  Straighten your right / left elbow. Hold for __________  seconds.  Allow your muscles to control the rubber exercise band/tubing as your hand returns to your shoulder. Repeat __________ times. Complete this exercise __________ times per day. Document Released: 08/19/2005 Document Revised: 11/11/2011 Document Reviewed: 12/01/2008 Exit[employer] Patient Information 2015 [employer]. This information is not intended to replace advice given to you by your health care provider. Make sure you discuss any questions you have with your health care provider.  

## 2015-02-21 NOTE — Progress Notes (Signed)
   Subjective:    Patient ID: Kevin Mcgee, male    DOB: Jan 19, 1971, 44 y.o.   MRN: [id number]  HPI Seen for the following issues  Left elbow pain. Duration about 3 months. No injury. Location is posterior aspect of the arm. Worse with forearm extension. Denies any other exacerbating factors. He's taken some Advil with mild relief. He has not tried any icing. Denies any medial or lateral elbow pain. No visible swelling or bruising.  Onset yesterday of left inner groin pain. Worse after getting out of a car. No edema. He describes throbbing pain yesterday. Better today. Took some ibuprofen which may have helped.  He has questions regarding Zika Virus.  He is preparing to get married next week in [place].  Past Medical History  Diagnosis Date  . Sarcoidosis   . Allergy   . Asthma    No past surgical history on file.  reports that he has quit smoking. His smoking use included Cigarettes. He smoked 0.25 packs per day. He has never used smokeless tobacco. He reports that he drinks alcohol. He reports that he does not use illicit drugs. family history includes Hypertension in his other. No Known Allergies    Review of Systems  Constitutional: Negative for fever and chills.  Respiratory: Negative for shortness of breath.   Musculoskeletal: Negative for gait problem.  Neurological: Negative for weakness and numbness.  Hematological: Negative for adenopathy.       Objective:   Physical Exam  Constitutional: He appears well-developed and well-nourished.  Cardiovascular: Normal rate and regular rhythm.   Pulmonary/Chest: Effort normal and breath sounds normal. No respiratory distress. He has no wheezes. He has no rales.  Musculoskeletal: He exhibits no edema.  Left elbow full range of motion. No lateral or medial tenderness. He has some tenderness along the distal triceps and pain with forearm extension against resistance. No biceps tenderness. No visible erythema and no  warmth  Left lower extremity no edema. Left groin is inspected and unremarkable. No inguinal adenopathy. No reproducible tenderness or pain          Assessment & Plan:  #1 probable left triceps tendinitis. Recommended icing 2-3 times daily 15-20 minutes of time. Consider topical Voltaren gel 2-3 times daily. #2 left groin pain. Transient. Improved already today. Suspect muscular strain. Gentle stretches. Continue ibuprofen as needed #3 travel consultation. Patient had several questions regarding Zika virus. We explained basically his own protection is avoidance and measures to reduce mosquito bites.

## 2015-02-21 NOTE — Progress Notes (Signed)
Pre visit review using our clinic review tool, if applicable. No additional management support is needed unless otherwise documented below in the visit note. 

## 2015-07-25 ENCOUNTER — Encounter: Payer: Self-pay | Admitting: Adult Health

## 2015-07-25 ENCOUNTER — Ambulatory Visit (INDEPENDENT_AMBULATORY_CARE_PROVIDER_SITE_OTHER): Payer: BLUE CROSS/BLUE SHIELD | Admitting: Adult Health

## 2015-07-25 VITALS — BP 110/70 | HR 78 | Temp 98.1°F | Ht 74.5 in | Wt 209.9 lb

## 2015-07-25 DIAGNOSIS — J069 Acute upper respiratory infection, unspecified: Secondary | ICD-10-CM | POA: Diagnosis not present

## 2015-07-25 MED ORDER — HYDROCODONE-HOMATROPINE 5-1.5 MG/5ML PO SYRP: 5.0000 mL | ORAL_SOLUTION | Freq: Three times a day (TID) | ORAL | Status: DC | PRN

## 2015-07-25 MED ORDER — ALBUTEROL SULFATE 108 (90 BASE) MCG/ACT IN AEPB: 1.0000 | INHALATION_SPRAY | Freq: Four times a day (QID) | RESPIRATORY_TRACT | Status: DC | PRN

## 2015-07-25 MED ORDER — DOXYCYCLINE HYCLATE 100 MG PO CAPS: 100.0000 mg | ORAL_CAPSULE | Freq: Two times a day (BID) | ORAL | Status: DC

## 2015-07-25 MED ORDER — METHYLPREDNISOLONE 4 MG PO TBPK: ORAL_TABLET | ORAL | Status: DC

## 2015-07-25 NOTE — Progress Notes (Signed)
Subjective:    Patient ID: Kevin Mcgee, male    DOB: 09/16/1970, 44 y.o.   MRN: [id number]  HPI  44 year old male who presents to the office today for two weeks of sinus pain and pressure, headache, productive cough and chest congestion. He denies having a fever, no nausea, vomiting, diarrhea.   7 days ago  he started taking prednisone ( unknown mg) that he had at home, he did his own taper of 3,3,2,1- he was not feeling any better so he quit taking prednisone.   He also had Augmentin at home and started taking that 5 days ago but also quit that due to not feeling as good as he thought he would.   He is unsure of how old the Augmentin and Prednisone are.   Review of Systems  Constitutional: Positive for fatigue. Negative for fever, chills, activity change and appetite change.  HENT: Positive for congestion, postnasal drip, rhinorrhea and sinus pressure. Negative for ear discharge, sore throat and trouble swallowing.   Respiratory: Positive for cough and chest tightness.   Cardiovascular: Negative.   Musculoskeletal: Negative.   Neurological: Positive for headaches.  Hematological: Positive for adenopathy.  All other systems reviewed and are negative.  Past Medical History  Diagnosis Date  . Sarcoidosis ([facility]CC)   . Allergy   . Asthma     Social History   Social History  . Marital Status: Single    Spouse Name: N/A  . Number of Children: N/A  . Years of Education: N/A   Occupational History  . Not on file.   Social History Main Topics  . Smoking status: Former Smoker -- 0.25 packs/day    Types: Cigarettes  . Smokeless tobacco: Never Used  . Alcohol Use: 0.0 oz/week    0 Standard drinks or equivalent per week  . Drug Use: No  . Sexual Activity: Not on file   Other Topics Concern  . Not on file   Social History Narrative    No past surgical history on file.  Family History  Problem Relation Age of Onset  . Hypertension Other     No Known  Allergies  Current Outpatient Prescriptions on File Prior to Visit  Medication Sig Dispense Refill  . cetirizine (ZYRTEC) 10 MG tablet Take 10 mg by mouth daily.      . diclofenac sodium (VOLTAREN) 1 % GEL Apply 2 g topically 4 (four) times daily. 1 Tube 1  . fluticasone (FLONASE) 50 MCG/ACT nasal spray Place 2 sprays into both nostrils daily. 16 g 5  . Multiple Vitamin (MULTIVITAMIN) tablet Take 1 tablet by mouth daily.      Marland Kitchen. omeprazole (PRILOSEC) 20 MG capsule Take 1 capsule (20 mg total) by mouth daily. 30 capsule 2   No current facility-administered medications on file prior to visit.    BP 110/70 mmHg  Pulse 78  Temp(Src) 98.1 F (36.7 C) (Oral)  Ht 6' 2.5" (1.892 m)  Wt 209 lb 14.4 oz (95.21 kg)  BMI 26.60 kg/m2  SpO2 96%       Objective:   Physical Exam  Constitutional: He is oriented to person, place, and time. He appears well-developed and well-nourished. No distress.  HENT:  Head: Normocephalic and atraumatic.  Right Ear: External ear normal.  Left Ear: External ear normal.  Nose: Nose normal.  Mouth/Throat: Oropharynx is clear and moist. No oropharyngeal exudate.  TM's visualized. No signs of infection Frontal and Maxillary pain with palpation   Eyes:  Conjunctivae are normal. Pupils are equal, round, and reactive to light. Right eye exhibits no discharge. Left eye exhibits no discharge.  Neck: Normal range of motion. Neck supple. No thyromegaly present.  Cardiovascular: Normal rate, regular rhythm, normal heart sounds and intact distal pulses.  Exam reveals no gallop and no friction rub.   No murmur heard. Pulmonary/Chest: Effort normal. No respiratory distress. He has wheezes. He has no rales. He exhibits no tenderness.  Trace wheezing throughout. Decreased breath sounds - sounds tight  Lymphadenopathy:    He has cervical adenopathy.  Neurological: He is alert and oriented to person, place, and time.  Skin: Skin is warm and dry. No rash noted. He is not  diaphoretic. No erythema. No pallor.  Psychiatric: He has a normal mood and affect. His behavior is normal. Judgment and thought content normal.  Nursing note and vitals reviewed.      Assessment & Plan:  1. Acute upper respiratory infection - doxycycline (VIBRAMYCIN) 100 MG capsule; Take 1 capsule (100 mg total) by mouth 2 (two) times daily.  Dispense: 14 capsule; Refill: 0 - methylPREDNISolone (MEDROL DOSEPAK) 4 MG TBPK tablet; Take as directed  Dispense: 21 tablet; Refill: 0 - HYDROcodone-homatropine (HYCODAN) 5-1.5 MG/5ML syrup; Take 5 mLs by mouth every 8 (eight) hours as needed for cough.  Dispense: 120 mL; Refill: 0 - Albuterol Sulfate (PROAIR RESPICLICK) 108 (90 BASE) MCG/ACT AEPB; Inhale 1-2 puffs into the lungs every 6 (six) hours as needed.  Dispense: 1 each; Refill: 3 - Follow up if no improvement in the next 2-3 days - Follow up sooner if fever greater than 101.  - Finish entire course of abx.

## 2015-07-25 NOTE — Progress Notes (Signed)
Pre visit review using our clinic review tool, if applicable. No additional management support is needed unless otherwise documented below in the visit note. 

## 2015-07-25 NOTE — Patient Instructions (Addendum)
It was great meeting you today.   Your exam is consistent with a upper respiratory tract infection  Take the doxycyline twice a day   Take the Medrol Dose Pack as directed  Use the cough syrup at night and as needed. This will make you sleepy. During the day you can use Mucinex or Delsym  If you are not feeling any better in 2-3 days, please let us know.   Upper Respiratory Infection, Adult Most upper respiratory infections (URIs) are a viral infection of the air passages leading to the lungs. A URI affects the nose, throat, and upper air passages. The most common type of URI is nasopharyngitis and is typically referred to as "the common cold." URIs run their course and usually go away on their own. Most of the time, a URI does not require medical attention, but sometimes a bacterial infection in the upper airways can follow a viral infection. This is called a secondary infection. Sinus and middle ear infections are common types of secondary upper respiratory infections. Bacterial pneumonia can also complicate a URI. A URI can worsen asthma and chronic obstructive pulmonary disease (COPD). Sometimes, these complications can require emergency medical care and may be life threatening.  CAUSES Almost all URIs are caused by viruses. A virus is a type of germ and can spread from one person to another.  RISKS FACTORS You may be at risk for a URI if:   You smoke.   You have chronic heart or lung disease.  You have a weakened defense (immune) system.   You are very young or very old.   You have nasal allergies or asthma.  You work in crowded or poorly ventilated areas.  You work in health care facilities or schools. SIGNS AND SYMPTOMS  Symptoms typically develop 2-3 days after you come in contact with a cold virus. Most viral URIs last 7-10 days. However, viral URIs from the influenza virus (flu virus) can last 14-18 days and are typically more severe. Symptoms may include:   Runny or  stuffy (congested) nose.   Sneezing.   Cough.   Sore throat.   Headache.   Fatigue.   Fever.   Loss of appetite.   Pain in your forehead, behind your eyes, and over your cheekbones (sinus pain).  Muscle aches.  DIAGNOSIS  Your health care provider may diagnose a URI by:  Physical exam.  Tests to check that your symptoms are not due to another condition such as:  Strep throat.  Sinusitis.  Pneumonia.  Asthma. TREATMENT  A URI goes away on its own with time. It cannot be cured with medicines, but medicines may be prescribed or recommended to relieve symptoms. Medicines may help:  Reduce your fever.  Reduce your cough.  Relieve nasal congestion. HOME CARE INSTRUCTIONS   Take medicines only as directed by your health care provider.   Gargle warm saltwater or take cough drops to comfort your throat as directed by your health care provider.  Use a warm mist humidifier or inhale steam from a shower to increase air moisture. This may make it easier to breathe.  Drink enough fluid to keep your urine clear or pale yellow.   Eat soups and other clear broths and maintain good nutrition.   Rest as needed.   Return to work when your temperature has returned to normal or as your health care provider advises. You may need to stay home longer to avoid infecting others. You can also use a face  mask and careful hand washing to prevent spread of the virus.  Increase the usage of your inhaler if you have asthma.   Do not use any tobacco products, including cigarettes, chewing tobacco, or electronic cigarettes. If you need help quitting, ask your health care provider. PREVENTION  The best way to protect yourself from getting a cold is to practice good hygiene.   Avoid oral or hand contact with people with cold symptoms.   Wash your hands often if contact occurs.  There is no clear evidence that vitamin C, vitamin E, echinacea, or exercise reduces the chance  of developing a cold. However, it is always recommended to get plenty of rest, exercise, and practice good nutrition.  SEEK MEDICAL CARE IF:   You are getting worse rather than better.   Your symptoms are not controlled by medicine.   You have chills.  You have worsening shortness of breath.  You have brown or red mucus.  You have yellow or brown nasal discharge.  You have pain in your face, especially when you bend forward.  You have a fever.  You have swollen neck glands.  You have pain while swallowing.  You have white areas in the back of your throat. SEEK IMMEDIATE MEDICAL CARE IF:   You have severe or persistent:  Headache.  Ear pain.  Sinus pain.  Chest pain.  You have chronic lung disease and any of the following:  Wheezing.  Prolonged cough.  Coughing up blood.  A change in your usual mucus.  You have a stiff neck.  You have changes in your:  Vision.  Hearing.  Thinking.  Mood. MAKE SURE YOU:   Understand these instructions.  Will watch your condition.  Will get help right away if you are not doing well or get worse.   This information is not intended to replace advice given to you by your health care provider. Make sure you discuss any questions you have with your health care provider.   Document Released: 02/12/2001 Document Revised: 01/03/2015 Document Reviewed: 11/24/2013 Elsevier Interactive Patient Education [employer]

## 2015-08-03 ENCOUNTER — Ambulatory Visit (INDEPENDENT_AMBULATORY_CARE_PROVIDER_SITE_OTHER): Payer: BLUE CROSS/BLUE SHIELD | Admitting: Adult Health

## 2015-08-03 ENCOUNTER — Encounter: Payer: Self-pay | Admitting: Adult Health

## 2015-08-03 ENCOUNTER — Telehealth: Payer: Self-pay | Admitting: Family Medicine

## 2015-08-03 VITALS — BP 112/76 | Temp 97.9°F | Ht 74.5 in | Wt 211.3 lb

## 2015-08-03 DIAGNOSIS — R519 Headache, unspecified: Secondary | ICD-10-CM

## 2015-08-03 DIAGNOSIS — R51 Headache: Secondary | ICD-10-CM | POA: Diagnosis not present

## 2015-08-03 MED ORDER — KETOROLAC TROMETHAMINE 60 MG/2ML IM SOLN
60.0000 mg | Freq: Once | INTRAMUSCULAR | Status: AC
  Administered 2015-08-03: 60 mg via INTRAMUSCULAR

## 2015-08-03 MED ORDER — CYCLOBENZAPRINE HCL 10 MG PO TABS: 10.0000 mg | ORAL_TABLET | Freq: Three times a day (TID) | ORAL | Status: DC | PRN

## 2015-08-03 MED ORDER — KETOROLAC TROMETHAMINE 60 MG/2ML IJ SOLN: 60.0000 mg | Freq: Once | INTRAMUSCULAR | Status: DC

## 2015-08-03 NOTE — Patient Instructions (Addendum)
It was great seeing you again!   It appears as though you have a tension headache. If the Toradol injection does not help, I have sent in a prescription for Flexeril. You can also try Excedrin Migraine.    Let me know if your headache is not resolved.      Tension Headache A tension headache is a feeling of pain, pressure, or aching that is often felt over the front and sides of the head. The pain can be dull, or it can feel tight (constricting). Tension headaches are not normally associated with nausea or vomiting, and they do not get worse with physical activity. Tension headaches can last from 30 minutes to several days. This is the most common type of headache. CAUSES The exact cause of this condition is not known. Tension headaches often begin after stress, anxiety, or depression. Other triggers may include:  Alcohol.  Too much caffeine, or caffeine withdrawal.  Respiratory infections, such as colds, flu, or sinus infections.  Dental problems or teeth clenching.  Fatigue.  Holding your head and neck in the same position for a long period of time, such as while using a computer.  Smoking. SYMPTOMS Symptoms of this condition include:  A feeling of pressure around the head.  Dull, aching head pain.  Pain felt over the front and sides of the head.  Tenderness in the muscles of the head, neck, and shoulders. DIAGNOSIS This condition may be diagnosed based on your symptoms and a physical exam. Tests may be done, such as a CT scan or an MRI of your head. These tests may be done if your symptoms are severe or unusual. TREATMENT This condition may be treated with lifestyle changes and medicines to help relieve symptoms. HOME CARE INSTRUCTIONS Managing Pain  Take over-the-counter and prescription medicines only as told by your health care provider.  Lie down in a dark, quiet room when you have a headache.  If directed, apply ice to the head and neck area:  Put ice in a  plastic bag.  Place a towel between your skin and the bag.  Leave the ice on for 20 minutes, 2-3 times per day.  Use a heating pad or a hot shower to apply heat to the head and neck area as told by your health care provider. Eating and Drinking  Eat meals on a regular schedule.  Limit alcohol use.  Decrease your caffeine intake, or stop using caffeine. General Instructions  Keep all follow-up visits as told by your health care provider. This is important.  Keep a headache journal to help find out what may trigger your headaches. For example, write down:  What you eat and drink.  How much sleep you get.  Any change to your diet or medicines.  Try massage or other relaxation techniques.  Limit stress.  Sit up straight, and avoid tensing your muscles.  Do not use tobacco products, including cigarettes, chewing tobacco, or e-cigarettes. If you need help quitting, ask your health care provider.  Exercise regularly as told by your health care provider.  Get 7-9 hours of sleep, or the amount recommended by your health care provider. SEEK MEDICAL CARE IF:  Your symptoms are not helped by medicine.  You have a headache that is different from what you normally experience.  You have nausea or you vomit.  You have a fever. SEEK IMMEDIATE MEDICAL CARE IF:  Your headache becomes severe.  You have repeated vomiting.  You have a stiff neck.  You have a loss of vision.  You have problems with speech.  You have pain in your eye or ear.  You have muscular weakness or loss of muscle control.  You lose your balance or you have trouble walking.  You feel faint or you pass out.  You have confusion.   This information is not intended to replace advice given to you by your health care provider. Make sure you discuss any questions you have with your health care provider.   Document Released: 08/19/2005 Document Revised: 05/10/2015 Document Reviewed: 12/12/2014 Elsevier  Interactive Patient Education [employer]

## 2015-08-03 NOTE — Telephone Encounter (Signed)
Patient Name: Kevin KnappRONALD Mcgee DOB: [birth year] Initial Comment Caller states he was seen last week, dx possible bronchitis. Has had headache constantly since last Thursday. Nurse Assessment Nurse: Elijah Birkaldwell, RN, Lynda Date/Time (Eastern Time): 08/03/2015 8:59:09 AM Confirm and document reason for call. If symptomatic, describe symptoms. ---Caller states he was seen last week, diagnosed with possible bronchitis. Has had headache constantly since last Thursday. No fever. Was on a milder steroid, antibiotic, cough rx. Has tried Naprosyn & Ibuprofen, Tylenol without much help. Has the patient traveled out of the country within the last 30 days? ---Not Applicable Does the patient have any new or worsening symptoms? ---Yes Will a triage be completed? ---Yes Related visit to physician within the last 2 weeks? ---Yes Does the PT have any chronic conditions? (i.e. diabetes, asthma, etc.) ---Yes List chronic conditions. ---acid reflux, on Zyrtec Is this a behavioral health call? ---No Guidelines Guideline Title Affirmed Question Affirmed Notes Headache [1] MODERATE headache (e.g., interferes with normal activities) AND [2] present > 24 hours AND [3] unexplained (Exceptions: analgesics not tried, typical migraine, or headache part of viral illness) Final Disposition User See Physician within 24 Hours Aguadillaaldwell, RN, ToysRusLynda Referrals REFERRED TO PCP OFFICE Disagree/Comply: Danella Maiersomply

## 2015-08-03 NOTE — Telephone Encounter (Signed)
Patient has an appointment.

## 2015-08-03 NOTE — Progress Notes (Signed)
Pre visit review using our clinic review tool, if applicable. No additional management support is needed unless otherwise documented below in the visit note. 

## 2015-08-03 NOTE — Progress Notes (Signed)
Subjective:    Patient ID: Kevin Mcgee, male    DOB: 1971/04/27, 44 y.o.   MRN: [id number]  HPI    44 year old male presents to the office today for the complaint of headache. Last saw him 9 days ago, for acute upper respiratory infection. He was prescribed a 7 day course of doxycycline as well as a Medrol Dosepak. All of his symptoms have resolved except for the headache. His headache starts in the back of the head" feels like it's in the middle my brain". The pain is described as a mild throbbing pain and is constant from morning till nighttime. He denies any photophobia, nausea, and in vomiting, visual disturbance. Does have a history of migraines as well as cluster headaches. Has tried ibuprofen and naproxen without relief.    Review of Systems  Constitutional: Negative.   Eyes: Negative.   Respiratory: Negative.   Cardiovascular: Negative.   Neurological: Positive for headaches. Negative for dizziness, weakness and light-headedness.  Psychiatric/Behavioral: Negative for confusion, sleep disturbance and decreased concentration.   Past Medical History  Diagnosis Date  . Sarcoidosis (HCC)   . Allergy   . Asthma     Social History   Social History  . Marital Status: Single    Spouse Name: N/A  . Number of Children: N/A  . Years of Education: N/A   Occupational History  . Not on file.   Social History Main Topics  . Smoking status: Former Smoker -- 0.25 packs/day    Types: Cigarettes  . Smokeless tobacco: Never Used  . Alcohol Use: 0.0 oz/week    0 Standard drinks or equivalent per week  . Drug Use: No  . Sexual Activity: Not on file   Other Topics Concern  . Not on file   Social History Narrative    No past surgical history on file.  Family History  Problem Relation Age of Onset  . Hypertension Other     No Known Allergies  Current Outpatient Prescriptions on File Prior to Visit  Medication Sig Dispense Refill  . Albuterol Sulfate (PROAIR  RESPICLICK) 108 (90 BASE) MCG/ACT AEPB Inhale 1-2 puffs into the lungs every 6 (six) hours as needed. 1 each 3  . cetirizine (ZYRTEC) 10 MG tablet Take 10 mg by mouth daily.      . diclofenac sodium (VOLTAREN) 1 % GEL Apply 2 g topically 4 (four) times daily. 1 Tube 1  . fluticasone (FLONASE) 50 MCG/ACT nasal spray Place 2 sprays into both nostrils daily. 16 g 5  . Multiple Vitamin (MULTIVITAMIN) tablet Take 1 tablet by mouth daily.      Marland Kitchen. omeprazole (PRILOSEC) 20 MG capsule Take 1 capsule (20 mg total) by mouth daily. 30 capsule 2   No current facility-administered medications on file prior to visit.    BP 112/76 mmHg  Temp(Src) 97.9 F (36.6 C) (Oral)  Ht 6' 2.5" (1.892 m)  Wt 211 lb 4.8 oz (95.845 kg)  BMI 26.77 kg/m2       Objective:   Physical Exam  Constitutional: He is oriented to person, place, and time. He appears well-developed and well-nourished. No distress.  Neck: Normal range of motion. Neck supple.  Musculoskeletal: Normal range of motion. He exhibits no edema or tenderness.  No nuchal rigidity  Lymphadenopathy:    He has no cervical adenopathy.  Neurological: He is alert and oriented to person, place, and time.  Skin: Skin is warm and dry. No rash noted. He is not diaphoretic. No  erythema. No pallor.  Psychiatric: He has a normal mood and affect. His behavior is normal. Judgment and thought content normal.  Nursing note and vitals reviewed.      Assessment & Plan:  1. Headache, unspecified headache type - 4 likely tension-type headache. Little to no concern for brain mass - cyclobenzaprine (FLEXERIL) 10 MG tablet; Take 1 tablet (10 mg total) by mouth 3 (three) times daily as needed for muscle spasms.  Dispense: 30 tablet; Refill: 0 - ketorolac (TORADOL) injection 60 mg; Inject 2 mLs (60 mg total) into the muscle once. - Excedrin migraine -Heating pad to the back of the neck - Follow up if no improvement.

## 2015-12-18 DIAGNOSIS — J069 Acute upper respiratory infection, unspecified: Secondary | ICD-10-CM | POA: Diagnosis not present

## 2015-12-18 DIAGNOSIS — J3089 Other allergic rhinitis: Secondary | ICD-10-CM | POA: Diagnosis not present

## 2016-01-31 ENCOUNTER — Ambulatory Visit (INDEPENDENT_AMBULATORY_CARE_PROVIDER_SITE_OTHER): Payer: BLUE CROSS/BLUE SHIELD | Admitting: Adult Health

## 2016-01-31 ENCOUNTER — Encounter: Payer: Self-pay | Admitting: Adult Health

## 2016-01-31 VITALS — BP 124/74 | Ht 74.5 in | Wt 207.7 lb

## 2016-01-31 DIAGNOSIS — G5602 Carpal tunnel syndrome, left upper limb: Secondary | ICD-10-CM

## 2016-01-31 MED ORDER — PREDNISONE 20 MG PO TABS: 20.0000 mg | ORAL_TABLET | Freq: Every day | ORAL | Status: DC

## 2016-01-31 NOTE — Patient Instructions (Addendum)
Your exam is consistent with carpal tunnel.   I have sent in a prescription for prednisone 20 mg. Take this once a day for 14 days.   You can also use a wrist brace, tylenol and ice  Follow up if no improvement after the prednisone treatment    Carpal Tunnel Syndrome Carpal tunnel syndrome is a condition that causes pain in your hand and arm. The carpal tunnel is a narrow area located on the palm side of your wrist. Repeated wrist motion or certain diseases may cause swelling within the tunnel. This swelling pinches the main nerve in the wrist (median nerve). CAUSES  This condition may be caused by:   Repeated wrist motions.  Wrist injuries.  Arthritis.  A cyst or tumor in the carpal tunnel.  Fluid buildup during pregnancy. Sometimes the cause of this condition is not known.  RISK FACTORS This condition is more likely to develop in:   People who have jobs that cause them to repeatedly move their wrists in the same motion, such as butchers and cashiers.  Women.  People with certain conditions, such as:  Diabetes.  Obesity.  An underactive thyroid (hypothyroidism).  Kidney failure. SYMPTOMS  Symptoms of this condition include:   A tingling feeling in your fingers, especially in your thumb, index, and middle fingers.  Tingling or numbness in your hand.  An aching feeling in your entire arm, especially when your wrist and elbow are bent for long periods of time.  Wrist pain that goes up your arm to your shoulder.  Pain that goes down into your palm or fingers.  A weak feeling in your hands. You may have trouble grabbing and holding items. Your symptoms may feel worse during the night.  DIAGNOSIS  This condition is diagnosed with a medical history and physical exam. You may also have tests, including:   An electromyogram (EMG). This test measures electrical signals sent by your nerves into the muscles.  X-rays. TREATMENT  Treatment for this condition  includes:  Lifestyle changes. It is important to stop doing or modify the activity that caused your condition.  Physical or occupational therapy.  Medicines for pain and inflammation. This may include medicine that is injected into your wrist.  A wrist splint.  Surgery. HOME CARE INSTRUCTIONS  If You Have a Splint:  Wear it as told by your health care provider. Remove it only as told by your health care provider.  Loosen the splint if your fingers become numb and tingle, or if they turn cold and blue.  Keep the splint clean and dry. General Instructions  Take over-the-counter and prescription medicines only as told by your health care provider.  Rest your wrist from any activity that may be causing your pain. If your condition is work related, talk to your employer about changes that can be made, such as getting a wrist pad to use while typing.  If directed, apply ice to the painful area:  Put ice in a plastic bag.  Place a towel between your skin and the bag.  Leave the ice on for 20 minutes, 2-3 times per day.  Keep all follow-up visits as told by your health care provider. This is important.  Do any exercises as told by your health care provider, physical therapist, or occupational therapist. SEEK MEDICAL CARE IF:   You have new symptoms.  Your pain is not controlled with medicines.  Your symptoms get worse.   This information is not intended to replace advice  given to you by your health care provider. Make sure you discuss any questions you have with your health care provider.   Document Released: 08/16/2000 Document Revised: 05/10/2015 Document Reviewed: 01/04/2015 Elsevier Interactive Patient Education [employer].

## 2016-01-31 NOTE — Progress Notes (Signed)
Subjective:    Patient ID: Kevin DutyRonald G Rasmusson, male    DOB: Dec 25, 1970, 45 y.o.   MRN: [id number]  HPI  45 year old male who presents to the office today for intermittent left hand pain. The pain is located in his left wrist with a radiating pain to the thumb and fingers. The pain is described as a numbness and he has had trouble with griping items. He does have pain at night but it is not waking him up.    Review of Systems  All other systems reviewed and are negative.  Past Medical History  Diagnosis Date  . Sarcoidosis (HCC)   . Allergy   . Asthma     Social History   Social History  . Marital Status: Single    Spouse Name: N/A  . Number of Children: N/A  . Years of Education: N/A   Occupational History  . Not on file.   Social History Main Topics  . Smoking status: Former Smoker -- 0.25 packs/day    Types: Cigarettes  . Smokeless tobacco: Never Used  . Alcohol Use: 0.0 oz/week    0 Standard drinks or equivalent per week  . Drug Use: No  . Sexual Activity: Not on file   Other Topics Concern  . Not on file   Social History Narrative    No past surgical history on file.  Family History  Problem Relation Age of Onset  . Hypertension Other     No Known Allergies  Current Outpatient Prescriptions on File Prior to Visit  Medication Sig Dispense Refill  . cetirizine (ZYRTEC) 10 MG tablet Take 10 mg by mouth daily.      . fluticasone (FLONASE) 50 MCG/ACT nasal spray Place 2 sprays into both nostrils daily. 16 g 5  . omeprazole (PRILOSEC) 20 MG capsule Take 1 capsule (20 mg total) by mouth daily. 30 capsule 2  . Albuterol Sulfate (PROAIR RESPICLICK) 108 (90 BASE) MCG/ACT AEPB Inhale 1-2 puffs into the lungs every 6 (six) hours as needed. (Patient not taking: Reported on 01/31/2016) 1 each 3  . diclofenac sodium (VOLTAREN) 1 % GEL Apply 2 g topically 4 (four) times daily. (Patient not taking: Reported on 01/31/2016) 1 Tube 1  . Multiple Vitamin (MULTIVITAMIN)  tablet Take 1 tablet by mouth daily. Reported on 01/31/2016     No current facility-administered medications on file prior to visit.    BP 124/74 mmHg  Ht 6' 2.5" (1.892 m)  Wt 207 lb 11.2 oz (94.212 kg)  BMI 26.32 kg/m2        Objective:   Physical Exam  Constitutional: He is oriented to person, place, and time. He appears well-developed and well-nourished. No distress.  Musculoskeletal: Normal range of motion. He exhibits tenderness. He exhibits no edema.  phalen test and tinel's sign positive   Neurological: He is alert and oriented to person, place, and time.  Skin: Skin is warm and dry. No rash noted. He is not diaphoretic. No erythema. No pallor.  Psychiatric: He has a normal mood and affect. His behavior is normal. Thought content normal.  Nursing note and vitals reviewed.      Assessment & Plan:  1. Carpal tunnel syndrome of left wrist - He is going to get a splint that fits him at the local drug store - predniSONE (DELTASONE) 20 MG tablet; Take 1 tablet (20 mg total) by mouth daily with breakfast.  Dispense: 14 tablet; Refill: 0 - Ice and Tylenol  - Follow  up if no improvement - Consider referral to sports medicine or neurology for steroid injection or nerve conduction study  Shirline Frees, NP

## 2016-03-08 DIAGNOSIS — R3989 Other symptoms and signs involving the genitourinary system: Secondary | ICD-10-CM | POA: Diagnosis not present

## 2016-03-15 ENCOUNTER — Encounter: Payer: Self-pay | Admitting: Adult Health

## 2016-03-15 ENCOUNTER — Ambulatory Visit (INDEPENDENT_AMBULATORY_CARE_PROVIDER_SITE_OTHER): Payer: BLUE CROSS/BLUE SHIELD | Admitting: Adult Health

## 2016-03-15 VITALS — BP 120/64 | Temp 98.6°F | Ht 74.5 in | Wt 205.0 lb

## 2016-03-15 DIAGNOSIS — J302 Other seasonal allergic rhinitis: Secondary | ICD-10-CM | POA: Diagnosis not present

## 2016-03-15 MED ORDER — AMOXICILLIN-POT CLAVULANATE 875-125 MG PO TABS: 1.0000 | ORAL_TABLET | Freq: Two times a day (BID) | ORAL | Status: DC

## 2016-03-15 NOTE — Progress Notes (Signed)
Subjective:    Patient ID: Kevin Mcgee, male    DOB: [birth year], 45 y.o.   MRN: [id number]  Sinusitis This is a recurrent problem. The current episode started in the past 7 days (3 days). There has been no fever. Associated symptoms include congestion, headaches and sinus pressure. Pertinent negatives include no ear pain. Past treatments include spray decongestants. The treatment provided no relief.   Is leaving to go out of the country in 3 days for a week    Review of Systems  HENT: Positive for congestion and sinus pressure. Negative for ear pain.   Neurological: Positive for headaches.   Past Medical History  Diagnosis Date  . Sarcoidosis (HCC)   . Allergy   . Asthma     Social History   Social History  . Marital Status: Single    Spouse Name: N/A  . Number of Children: N/A  . Years of Education: N/A   Occupational History  . Not on file.   Social History Main Topics  . Smoking status: Former Smoker -- 0.25 packs/day    Types: Cigarettes  . Smokeless tobacco: Never Used  . Alcohol Use: 0.0 oz/week    0 Standard drinks or equivalent per week  . Drug Use: No  . Sexual Activity: Not on file   Other Topics Concern  . Not on file   Social History Narrative    No past surgical history on file.  Family History  Problem Relation Age of Onset  . Hypertension Other     No Known Allergies  Current Outpatient Prescriptions on File Prior to Visit  Medication Sig Dispense Refill  . cetirizine (ZYRTEC) 10 MG tablet Take 10 mg by mouth daily.      . fluticasone (FLONASE) 50 MCG/ACT nasal spray Place 2 sprays into both nostrils daily. 16 g 5  . Multiple Vitamin (MULTIVITAMIN) tablet Take 1 tablet by mouth daily. Reported on 01/31/2016    . omeprazole (PRILOSEC) 20 MG capsule Take 1 capsule (20 mg total) by mouth daily. 30 capsule 2  . Albuterol Sulfate (PROAIR RESPICLICK) 108 (90 BASE) MCG/ACT AEPB Inhale 1-2 puffs into the lungs every 6 (six) hours as needed.  (Patient not taking: Reported on 01/31/2016) 1 each 3  . diclofenac sodium (VOLTAREN) 1 % GEL Apply 2 g topically 4 (four) times daily. (Patient not taking: Reported on 01/31/2016) 1 Tube 1   No current facility-administered medications on file prior to visit.    BP 120/64 mmHg  Temp(Src) 98.6 F (37 C) (Oral)  Ht 6' 2.5" (1.892 m)  Wt 205 lb (92.987 kg)  BMI 25.98 kg/m2       Objective:   Physical Exam  Constitutional: He appears well-developed and well-nourished. No distress.  HENT:  Head: Normocephalic and atraumatic.  Right Ear: Hearing, tympanic membrane, external ear and ear canal normal.  Left Ear: Hearing, tympanic membrane and external ear normal.  Nose: Nose normal. No mucosal edema or rhinorrhea. Right sinus exhibits no maxillary sinus tenderness and no frontal sinus tenderness. Left sinus exhibits no maxillary sinus tenderness and no frontal sinus tenderness.  Mouth/Throat: Uvula is midline, oropharynx is clear and moist and mucous membranes are normal. No oropharyngeal exudate, posterior oropharyngeal edema, posterior oropharyngeal erythema or tonsillar abscesses.  Eyes: Conjunctivae are normal. Pupils are equal, round, and reactive to light. Right eye exhibits no discharge. Left eye exhibits no discharge.  Neck: Normal range of motion. Neck supple.  Cardiovascular: Normal rate, regular rhythm, normal heart  sounds and intact distal pulses.  Exam reveals no gallop and no friction rub.   No murmur heard. Pulmonary/Chest: Effort normal and breath sounds normal. No respiratory distress. He has no wheezes. He has no rales. He exhibits no tenderness.  Lymphadenopathy:    He has no cervical adenopathy.  Neurological: He is alert.  Skin: Skin is warm and dry. No rash noted. He is not diaphoretic. No erythema. No pallor.  Psychiatric: He has a normal mood and affect. His behavior is normal. Thought content normal.  Nursing note and vitals reviewed.     Assessment & Plan:  1.  Seasonal allergies - Likely seasonal allergies or viral URI.  - Since he is going out of the country I will give him a script to take with him for Augmentin. He is advised to wait 3-5 more days to see if symptoms resolve. Continue Flonase - amoxicillin-clavulanate (AUGMENTIN) 875-125 MG tablet; Take 1 tablet by mouth 2 (two) times daily.  Dispense: 14 tablet; Refill: 0 - Follow up as needed  Shirline Frees, NP

## 2016-06-14 DIAGNOSIS — R51 Headache: Secondary | ICD-10-CM | POA: Diagnosis not present

## 2016-06-14 DIAGNOSIS — J309 Allergic rhinitis, unspecified: Secondary | ICD-10-CM | POA: Diagnosis not present

## 2016-08-06 DIAGNOSIS — J111 Influenza due to unidentified influenza virus with other respiratory manifestations: Secondary | ICD-10-CM | POA: Diagnosis not present

## 2016-08-06 DIAGNOSIS — R05 Cough: Secondary | ICD-10-CM | POA: Diagnosis not present

## 2016-08-07 ENCOUNTER — Ambulatory Visit: Payer: BLUE CROSS/BLUE SHIELD | Admitting: Family Medicine

## 2016-08-14 DIAGNOSIS — H52223 Regular astigmatism, bilateral: Secondary | ICD-10-CM | POA: Diagnosis not present

## 2016-11-18 ENCOUNTER — Ambulatory Visit (INDEPENDENT_AMBULATORY_CARE_PROVIDER_SITE_OTHER): Payer: BLUE CROSS/BLUE SHIELD | Admitting: Family Medicine

## 2016-11-18 ENCOUNTER — Encounter: Payer: Self-pay | Admitting: Family Medicine

## 2016-11-18 VITALS — BP 138/88 | HR 90 | Temp 98.0°F | Wt 211.8 lb

## 2016-11-18 DIAGNOSIS — J0140 Acute pansinusitis, unspecified: Secondary | ICD-10-CM

## 2016-11-18 MED ORDER — AMOXICILLIN-POT CLAVULANATE 875-125 MG PO TABS: 1.0000 | ORAL_TABLET | Freq: Two times a day (BID) | ORAL | 0 refills | Status: DC

## 2016-11-18 NOTE — Progress Notes (Addendum)
Patient ID: Kevin Mcgee, male   DOB: 10/10/1970, 46 y.o.   MRN: [id number]  PCP: Evette GeorgesDD,JEFFREY ALLEN, MD  Subjective:  Kevin Mcgee is a 46 y.o. year old very pleasant male patient who presents with Upper Respiratory infection symptoms including nasal congestion, and  cough productive of green sputum. -started: 2 weeks ago, symptoms have not changed. -previous treatments: Zytrec and flonase have not provided benefit. He started taking prednisone which he had at home.  He reports that he is tapering this dose. Taper reported as 3,3,2,2 1, 1, half tab for 3 days which provided excellent benefit. -sick contacts/travel/risks: denies flu exposure. No recent sick contacts. -Hx of: allergies  ROS-denies fever, SOB, NVD, tooth pain  Pertinent Past Medical History- pulmonary sarcoidosis which he reports is in remission  Medications- reviewed  Current Outpatient Prescriptions  Medication Sig Dispense Refill  . Albuterol Sulfate (PROAIR RESPICLICK) 108 (90 BASE) MCG/ACT AEPB Inhale 1-2 puffs into the lungs every 6 (six) hours as needed. 1 each 3  . cetirizine (ZYRTEC) 10 MG tablet Take 10 mg by mouth daily.      . diclofenac sodium (VOLTAREN) 1 % GEL Apply 2 g topically 4 (four) times daily. 1 Tube 1  . fluticasone (FLONASE) 50 MCG/ACT nasal spray Place 2 sprays into both nostrils daily. 16 g 5  . Multiple Vitamin (MULTIVITAMIN) tablet Take 1 tablet by mouth daily. Reported on 01/31/2016    . omeprazole (PRILOSEC) 20 MG capsule Take 1 capsule (20 mg total) by mouth daily. 30 capsule 2   No current facility-administered medications for this visit.     Objective: BP 138/88 (BP Location: Left Arm, Patient Position: Sitting, Cuff Size: Normal)   Pulse 90   Temp 98 F (36.7 C) (Oral)   Wt 211 lb 12.8 oz (96.1 kg)   SpO2 97%   BMI 26.83 kg/m  Gen: NAD, resting comfortably HEENT: Turbinates erythematous, TM normal, pharynx mildly erythematous with no tonsilar exudate or edema, +  sinus  tenderness, rhinorrhea CV: RRR no murmurs rubs or gallops Lungs: CTAB no crackles, wheeze, rhonchi Abdomen: soft/nontender/nondistended/normal bowel sounds. No rebound or guarding.  Ext: no edema Skin: warm, dry, no rash Neuro: grossly normal, moves all extremities  Assessment/Plan: 1. Acute pansinusitis, recurrence not specified -- amoxicillin-clavulanate (AUGMENTIN) 875-125 MG tablet; Take 1 tablet by mouth 2 (two) times daily.  Dispense: 20 tablet; Refill: 0  We discussed the importance of follow up for physical/lab work at his convenience.  Finally, we reviewed reasons to return to care including if symptoms worsen or persist or new concerns arise- once again particularly shortness of breath or fever >101.    Inez CatalinaJulia Ann Carnisha Feltz, FNP

## 2016-11-18 NOTE — Progress Notes (Signed)
Pre visit review using our clinic review tool, if applicable. No additional management support is needed unless otherwise documented below in the visit note. 

## 2016-11-18 NOTE — Patient Instructions (Addendum)
It was a pleasure to see you today. Please take medication as directed and follow up if symptoms do not improve with treatments, worsen, or you develop a fever >101.   Sinus Headache A sinus headache occurs when the paranasal sinuses become clogged or swollen. Paranasal sinuses are air pockets within the bones of the face. Sinus headaches can range from mild to severe. What are the causes? A sinus headache can result from various conditions that affect the sinuses, such as:  Colds.  Sinus infections.  Allergies. What are the signs or symptoms? The main symptom of this condition is a headache that may feel like pain or pressure in the face, forehead, ears, or upper teeth. People who have a sinus headache often have other symptoms, such as:  Congested or runny nose.  Fever.  Inability to smell. Weather changes can make symptoms worse. How is this diagnosed? This condition may be diagnosed based on:  A physical exam and medical history.  Imaging tests, such as a CT scan and MRI, to check for problems with the sinuses.  A specialist may look into the sinuses with a tool that has a camera (endoscopy). How is this treated? Treatment for this condition depends on the cause.  Sinus pain that is caused by a sinus infection may be treated with antibiotic medicine.  Sinus pain that is caused by allergies may be helped by allergy medicines (antihistamines) and medicated nasal sprays.  Sinus pain that is caused by congestion may be helped by flushing the nose and sinuses with saline solution. Follow these instructions at home:  Take medicines only as directed by your health care provider.  If you were prescribed an antibiotic medicine, finish all of it even if you start to feel better.  If you have congestion, use a nasal spray to help reduce pressure.  If directed, apply a warm, moist washcloth to your face to help relieve pain. Contact a health care provider if:  You have  headaches more than one time each week.  You have sensitivity to light or sound.  You have a fever.  You feel sick to your stomach (nauseous) or you throw up (vomit).  Your headaches do not get better with treatment. Many people think that they have a sinus headache when they actually have migraines or tension headaches. Get help right away if:  You have vision problems.  You have sudden, severe pain in your face or head.  You have a seizure.  You are confused.  You have a stiff neck. This information is not intended to replace advice given to you by your health care provider. Make sure you discuss any questions you have with your health care provider. Document Released: 09/26/2004 Document Revised: 04/14/2016 Document Reviewed: 08/15/2014 Elsevier Interactive Patient Education  2017 Else[employer] Inc[employer]   WE NOW OFFER   [facility] FAST TRACK!!!  SAME DAY Appointments for ACUTE CARE  Such as: Sprains, Injuries, cuts, abrasions, rashes, muscle pain, joint pain, back pain Colds, flu, sore throats, headache, allergies, cough, fever  Ear pain, sinus and eye infections Abdominal pain, nausea, vomiting, diarrhea, upset stomach Animal/insect bites  3 Easy Ways to Schedule: Walk-In Scheduling Call in scheduling Mychart Sign-up: [email or web address]

## 2016-12-24 ENCOUNTER — Encounter: Payer: Self-pay | Admitting: Family Medicine

## 2016-12-24 ENCOUNTER — Ambulatory Visit (INDEPENDENT_AMBULATORY_CARE_PROVIDER_SITE_OTHER): Payer: BLUE CROSS/BLUE SHIELD | Admitting: Family Medicine

## 2016-12-24 VITALS — BP 130/90 | HR 88 | Temp 98.1°F | Wt 212.9 lb

## 2016-12-24 DIAGNOSIS — K5900 Constipation, unspecified: Secondary | ICD-10-CM

## 2016-12-24 DIAGNOSIS — K9289 Other specified diseases of the digestive system: Secondary | ICD-10-CM | POA: Diagnosis not present

## 2016-12-24 NOTE — Progress Notes (Signed)
Pre visit review using our clinic review tool, if applicable. No additional management support is needed unless otherwise documented below in the visit note. 

## 2016-12-24 NOTE — Patient Instructions (Signed)
Constipation, Adult Constipation is when a person has fewer bowel movements in a week than normal, has difficulty having a bowel movement, or has stools that are dry, hard, or larger than normal. Constipation may be caused by an underlying condition. It may become worse with age if a person takes certain medicines and does not take in enough fluids. Follow these instructions at home: Eating and drinking    Eat foods that have a lot of fiber, such as fresh fruits and vegetables, whole grains, and beans.  Limit foods that are high in fat, low in fiber, or overly processed, such as french fries, hamburgers, cookies, candies, and soda.  Drink enough fluid to keep your urine clear or pale yellow. General instructions   Exercise regularly or as told by your health care provider.  Go to the restroom when you have the urge to go. Do not hold it in.  Take over-the-counter and prescription medicines only as told by your health care provider. These include any fiber supplements.  Practice pelvic floor retraining exercises, such as deep breathing while relaxing the lower abdomen and pelvic floor relaxation during bowel movements.  Watch your condition for any changes.  Keep all follow-up visits as told by your health care provider. This is important. Contact a health care provider if:  You have pain that gets worse.  You have a fever.  You do not have a bowel movement after 4 days.  You vomit.  You are not hungry.  You lose weight.  You are bleeding from the anus.  You have thin, pencil-like stools. Get help right away if:  You have a fever and your symptoms suddenly get worse.  You leak stool or have blood in your stool.  Your abdomen is bloated.  You have severe pain in your abdomen.  You feel dizzy or you faint. This information is not intended to replace advice given to you by your health care provider. Make sure you discuss any questions you have with your health care  provider. Document Released: 05/17/2004 Document Revised: 03/08/2016 Document Reviewed: 02/07/2016 Elsevier Interactive Patient Education  2017 Elsevier Inc.  Step up to more consistent exercise Try to scale back sugars and starches Consider free calorie tracking app such as MyFitnessPal Consider Miralax for any severe constipation symptoms. Consider physical at some point this year.

## 2016-12-24 NOTE — Progress Notes (Signed)
Subjective:     Patient ID: Kevin Mcgee, male   DOB: Oct 20, 1970, 46 y.o.   MRN: [id number]  HPI Patient seen with 2 week history of some abdominal bloating and increased gas symptoms. He's had some constipation intermittently. He states had significant weight gain over recent months. Not exercising. Denies any bloody stools. No dietary changes. Appetite stable. He tried some over-the-counter Gas-X without much improvement. He has never had any associated abdominal pain. No dysuria.  Past Medical History:  Diagnosis Date  . Allergy   . Asthma   . Sarcoidosis    No past surgical history on file.  reports that he has quit smoking. His smoking use included Cigarettes. He smoked 0.25 packs per day. He has never used smokeless tobacco. He reports that he drinks alcohol. He reports that he does not use drugs. family history includes Hypertension in his other. No Known Allergies   Review of Systems  Constitutional: Negative for appetite change and fever.  Cardiovascular: Negative for chest pain.  Gastrointestinal: Positive for constipation. Negative for abdominal pain, blood in stool, diarrhea, nausea and vomiting.  Endocrine: Negative for polydipsia and polyuria.  Genitourinary: Negative for dysuria.       Objective:   Physical Exam  Constitutional: He appears well-developed and well-nourished.  Cardiovascular: Normal rate and regular rhythm.   Pulmonary/Chest: Effort normal and breath sounds normal. No respiratory distress. He has no wheezes. He has no rales.  Abdominal: Soft. Bowel sounds are normal. He exhibits no distension and no mass. There is no tenderness. There is no rebound and no guarding.       Assessment:     Patient presents with intermittent abdominal bloating and constipation symptoms.   He does not have any red flags such as appetite loss, bloody stool, etc. He has had some recent weight gain    Plan:   -Recommend he step up regular exercise habits -Reduce  sugars and starches -Handout on constipation given -MiraLAX as needed if constipation not relieved with increase fluid intake, exercise, and good fiber intake -He is encouraged to set up complete physical  Kristian Covey MD [facility] at [facility]

## 2017-04-16 ENCOUNTER — Ambulatory Visit (INDEPENDENT_AMBULATORY_CARE_PROVIDER_SITE_OTHER): Payer: BLUE CROSS/BLUE SHIELD | Admitting: Adult Health

## 2017-04-16 ENCOUNTER — Encounter: Payer: Self-pay | Admitting: Adult Health

## 2017-04-16 VITALS — BP 122/84 | Temp 98.0°F | Wt 205.0 lb

## 2017-04-16 DIAGNOSIS — J301 Allergic rhinitis due to pollen: Secondary | ICD-10-CM | POA: Diagnosis not present

## 2017-04-16 NOTE — Progress Notes (Signed)
Subjective:    Patient ID: Kevin Mcgee, male    DOB: 06-Aug-1971, 46 y.o.   MRN: [id number]  HPI  46 year old male who  has a past medical history of Allergy; Asthma; and Sarcoidosis. He presents to the office today with the acute complaint of productive cough and sinus drainage x 3 days. He reports that on the first day of his symptoms he may have seen dark red blood in his sputum. He denies fevers, chest congestion, fevers, SOB, or feeling acutely ill. He uses Flonase daily to help with this allergy symptoms. Productive cough is worse in the morning when he wakes up.    Review of Systems See HPI   Past Medical History:  Diagnosis Date  . Allergy   . Asthma   . Sarcoidosis     Social History   Social History  . Marital status: Single    Spouse name: N/A  . Number of children: N/A  . Years of education: N/A   Occupational History  . Not on file.   Social History Main Topics  . Smoking status: Former Smoker    Packs/day: 0.25    Types: Cigarettes  . Smokeless tobacco: Never Used  . Alcohol use 0.0 oz/week  . Drug use: No  . Sexual activity: Not on file   Other Topics Concern  . Not on file   Social History Narrative  . No narrative on file    No past surgical history on file.  Family History  Problem Relation Age of Onset  . Hypertension Other     No Known Allergies  Current Outpatient Prescriptions on File Prior to Visit  Medication Sig Dispense Refill  . cetirizine (ZYRTEC) 10 MG tablet Take 10 mg by mouth daily.      . fluticasone (FLONASE) 50 MCG/ACT nasal spray Place 2 sprays into both nostrils daily. 16 g 5  . omeprazole (PRILOSEC) 20 MG capsule Take 1 capsule (20 mg total) by mouth daily. 30 capsule 2  . Albuterol Sulfate (PROAIR RESPICLICK) 108 (90 BASE) MCG/ACT AEPB Inhale 1-2 puffs into the lungs every 6 (six) hours as needed. (Patient not taking: Reported on 12/24/2016) 1 each 3  . Multiple Vitamin (MULTIVITAMIN) tablet Take 1 tablet by  mouth daily. Reported on 01/31/2016     No current facility-administered medications on file prior to visit.     BP 122/84 (BP Location: Left Arm)   Temp 98 F (36.7 C) (Oral)   Wt 205 lb (93 kg)   BMI 25.97 kg/m       Objective:   Physical Exam  Constitutional: He is oriented to person, place, and time. He appears well-developed and well-nourished. No distress.  HENT:  Right Ear: Hearing, tympanic membrane, external ear and ear canal normal.  Left Ear: Hearing, tympanic membrane, external ear and ear canal normal.  Nose: Mucosal edema, rhinorrhea (clear) and nose lacerations present. No epistaxis. Right sinus exhibits no maxillary sinus tenderness and no frontal sinus tenderness. Left sinus exhibits no maxillary sinus tenderness and no frontal sinus tenderness.  Mouth/Throat: Uvula is midline, oropharynx is clear and moist and mucous membranes are normal.  Cardiovascular: Normal rate, regular rhythm, normal heart sounds and intact distal pulses.  Exam reveals no gallop and no friction rub.   No murmur heard. Pulmonary/Chest: Effort normal and breath sounds normal. No respiratory distress. He has no wheezes. He has no rales. He exhibits no tenderness.  Neurological: He is alert and oriented to person, place,  and time.  Skin: Skin is warm and dry. No rash noted. He is not diaphoretic. No erythema. No pallor.  Psychiatric: He has a normal mood and affect. His behavior is normal. Judgment and thought content normal.  Nursing note and vitals reviewed.     Assessment & Plan:  1. Seasonal allergic rhinitis due to pollen - Symptoms appear to be consistent with seasonal allergy symptoms. Possible epistaxis likely from flonase use. Advised to add normal saline nasal spray to daily regimen  - No concern for pneumonia or sinusitis  - Follow up as needed  Shirline Freesory Aldena Worm, NP

## 2017-04-17 ENCOUNTER — Telehealth: Payer: Self-pay | Admitting: Adult Health

## 2017-04-17 NOTE — Telephone Encounter (Signed)
Pt states he thinks the blood from his sputum may be coming from his chest and not his nasal passages. Would like to know if Kandee KeenCory thinks this needs further investigation. Please call back.

## 2017-04-17 NOTE — Telephone Encounter (Signed)
Spoke to the pt after reviewing Cory's last note.  He has not stopped the nasal spray.  Advised to stop and try saline rinse if needed.  Most likely sinus/bronchial irritation.  Advised if new sx start or epistaxis has not stopped than call back on Monday.  Pt agreed.

## 2017-04-29 DIAGNOSIS — M546 Pain in thoracic spine: Secondary | ICD-10-CM | POA: Diagnosis not present

## 2017-05-06 DIAGNOSIS — M609 Myositis, unspecified: Secondary | ICD-10-CM | POA: Diagnosis not present

## 2017-05-07 ENCOUNTER — Ambulatory Visit (INDEPENDENT_AMBULATORY_CARE_PROVIDER_SITE_OTHER): Payer: BLUE CROSS/BLUE SHIELD | Admitting: Family Medicine

## 2017-05-07 ENCOUNTER — Encounter: Payer: Self-pay | Admitting: Family Medicine

## 2017-05-07 VITALS — BP 124/88 | HR 103 | Temp 98.3°F | Wt 205.9 lb

## 2017-05-07 DIAGNOSIS — M542 Cervicalgia: Secondary | ICD-10-CM | POA: Diagnosis not present

## 2017-05-07 MED ORDER — PREDNISONE 10 MG PO TABS
ORAL_TABLET | ORAL | 0 refills | Status: DC
Start: 2017-05-07 — End: 2017-12-17

## 2017-05-07 MED ORDER — CYCLOBENZAPRINE HCL 10 MG PO TABS: 10.0000 mg | ORAL_TABLET | Freq: Every day | ORAL | 0 refills | Status: DC

## 2017-05-07 NOTE — Patient Instructions (Addendum)
Cervical Strain and Sprain Rehab Ask your health care provider which exercises are safe for you. Do exercises exactly as told by your health care provider and adjust them as directed. It is normal to feel mild stretching, pulling, tightness, or discomfort as you do these exercises, but you should stop right away if you feel sudden pain or your pain gets worse.Do not begin these exercises until told by your health care provider. Stretching and range of motion exercises These exercises warm up your muscles and joints and improve the movement and flexibility of your neck. These exercises also help to relieve pain, numbness, and tingling. Exercise A: Cervical side bend  1. Using good posture, sit on a stable chair or stand up. 2. Without moving your shoulders, slowly tilt your left / right ear to your shoulder until you feel a stretch in your neck muscles. You should be looking straight ahead. 3. Hold for __________ seconds. 4. Repeat with the other side of your neck. Repeat __________ times. Complete this exercise __________ times a day. Exercise B: Cervical rotation  1. Using good posture, sit on a stable chair or stand up. 2. Slowly turn your head to the side as if you are looking over your left / right shoulder. ? Keep your eyes level with the ground. ? Stop when you feel a stretch along the side and the back of your neck. 3. Hold for __________ seconds. 4. Repeat this by turning to your other side. Repeat __________ times. Complete this exercise __________ times a day. Exercise C: Thoracic extension and pectoral stretch 1. Roll a towel or a small blanket so it is about 4 inches (10 cm) in diameter. 2. Lie down on your back on a firm surface. 3. Put the towel lengthwise, under your spine in the middle of your back. It should not be not under your shoulder blades. The towel should line up with your spine from your middle back to your lower back. 4. Put your hands behind your head and let your  elbows fall out to your sides. 5. Hold for __________ seconds. Repeat __________ times. Complete this exercise __________ times a day. Strengthening exercises These exercises build strength and endurance in your neck. Endurance is the ability to use your muscles for a long time, even after your muscles get tired. Exercise D: Upper cervical flexion, isometric 1. Lie on your back with a thin pillow behind your head and a small rolled-up towel under your neck. 2. Gently tuck your chin toward your chest and nod your head down to look toward your feet. Do not lift your head off the pillow. 3. Hold for __________ seconds. 4. Release the tension slowly. Relax your neck muscles completely before you repeat this exercise. Repeat __________ times. Complete this exercise __________ times a day. Exercise E: Cervical extension, isometric  1. Stand about 6 inches (15 cm) away from a wall, with your back facing the wall. 2. Place a soft object, about 6-8 inches (15-20 cm) in diameter, between the back of your head and the wall. A soft object could be a small pillow, a ball, or a folded towel. 3. Gently tilt your head back and press into the soft object. Keep your jaw and forehead relaxed. 4. Hold for __________ seconds. 5. Release the tension slowly. Relax your neck muscles completely before you repeat this exercise. Repeat __________ times. Complete this exercise __________ times a day. Posture and body mechanics  Body mechanics refers to the movements and positions of   your body while you do your daily activities. Posture is part of body mechanics. Good posture and healthy body mechanics can help to relieve stress in your body's tissues and joints. Good posture means that your spine is in its natural S-curve position (your spine is neutral), your shoulders are pulled back slightly, and your head is not tipped forward. The following are general guidelines for applying improved posture and body mechanics to  your everyday activities. Standing  When standing, keep your spine neutral and keep your feet about hip-width apart. Keep a slight bend in your knees. Your ears, shoulders, and hips should line up.  When you do a task in which you stand in one place for a long time, place one foot up on a stable object that is 2-4 inches (5-10 cm) high, such as a footstool. This helps keep your spine neutral. Sitting   When sitting, keep your spine neutral and your keep feet flat on the floor. Use a footrest, if necessary, and keep your thighs parallel to the floor. Avoid rounding your shoulders, and avoid tilting your head forward.  When working at a desk or a computer, keep your desk at a height where your hands are slightly lower than your elbows. Slide your chair under your desk so you are close enough to maintain good posture.  When working at a computer, place your monitor at a height where you are looking straight ahead and you do not have to tilt your head forward or downward to look at the screen. Resting When lying down and resting, avoid positions that are most painful for you. Try to support your neck in a neutral position. You can use a contour pillow or a small rolled-up towel. Your pillow should support your neck but not push on it. This information is not intended to replace advice given to you by your health care provider. Make sure you discuss any questions you have with your health care provider. Document Released: 08/19/2005 Document Revised: 04/25/2016 Document Reviewed: 07/26/2015 Elsevier Interactive Patient Education  2018 [employer]  

## 2017-05-07 NOTE — Progress Notes (Signed)
Subjective:    Patient ID: Kevin Mcgee, male    DOB: February 15, 1971, 46 y.o.   MRN: [id number]  Chief Complaint  Patient presents with  . Neck Pain    x several weeks. Seen chiro twice and got massage w/ no relief. Feels like 'sharp pins' when turning head and soreness/throbbing on front of neck.    HPI Patient was seen today for acute visit.  Neck pain: -started 2 wks ago -does not recall injury.  Changes pillow q 3 mo. -Pt has seen chiropractor x2 for adjustment and massage with no relief -Has tried ibuprofen and wife's muscle relaxer which helped some -Pain noted as sharp, on sides of neck when turns head laterally.   -Pt also mentions feeling a pulsation-like feeling.  Past Medical History:  Diagnosis Date  . Allergy   . Asthma   . Sarcoidosis     No past surgical history on file.  Family History  Problem Relation Age of Onset  . Hypertension Other     Social History   Social History  . Marital status: Single    Spouse name: N/A  . Number of children: N/A  . Years of education: N/A   Occupational History  . Not on file.   Social History Main Topics  . Smoking status: Former Smoker    Packs/day: 0.25    Types: Cigarettes  . Smokeless tobacco: Never Used  . Alcohol use 0.0 oz/week  . Drug use: No  . Sexual activity: Not on file   Other Topics Concern  . Not on file   Social History Narrative  . No narrative on file    Outpatient Medications Prior to Visit  Medication Sig Dispense Refill  . cetirizine (ZYRTEC) 10 MG tablet Take 10 mg by mouth daily.      . fluticasone (FLONASE) 50 MCG/ACT nasal spray Place 2 sprays into both nostrils daily. (Patient taking differently: Place 2 sprays into both nostrils daily as needed. ) 16 g 5  . Multiple Vitamin (MULTIVITAMIN) tablet Take 1 tablet by mouth daily as needed. Reported on 01/31/2016    . omeprazole (PRILOSEC) 20 MG capsule Take 1 capsule (20 mg total) by mouth daily. (Patient taking differently: Take 20  mg by mouth daily as needed. ) 30 capsule 2  . OVER THE COUNTER MEDICATION Slimvance - Core slimming    . Albuterol Sulfate (PROAIR RESPICLICK) 108 (90 BASE) MCG/ACT AEPB Inhale 1-2 puffs into the lungs every 6 (six) hours as needed. (Patient not taking: Reported on 05/07/2017) 1 each 3   No facility-administered medications prior to visit.     No Known Allergies  ROS  General: Denies fever, chills, night sweats, changes in weight, changes in appetite HEENT: Denies headaches, ear pain, changes in vision, rhinorrhea, sore throat  +neck pain CV: Denies CP, palpitations, SOB, orthopnea Pulm: Denies SOB, cough, wheezing GI: Denies abdominal pain, nausea, vomiting, diarrhea, constipation GU: Denies dysuria, hematuria, frequency, vaginal discharge Msk: Denies muscle cramps, joint pains Neuro: Denies weakness, numbness, tingling Skin: Denies rashes, bruising Psych: Denies depression, anxiety, hallucinations      Objective:    Blood pressure 124/88, pulse (!) 103, temperature 98.3 F (36.8 C), temperature source Oral, weight 205 lb 14.4 oz (93.4 kg), SpO2 98 %.   Gen. Pleasant, well-nourished, in no distress, normal affect.  HEENT: [place]/AT, wearing glasses, face symmetric, no scleral icterus, PERRLA, nares patent without drainage, pharynx without erythema or exudate. Neck: No JVD, no thyromegaly, no carotid bruits.  Mild  TTP of R lateral neck anterior scalene.  Lungs: no accessory muscle use Cardiovascular: RRE, no m/r/g, no peripheral edema Musculoskeletal: No deformities, no cyanosis or clubbing, normal tone Neuro:  A&Ox3, CN II-XII intact, normal gait Skin:  Warm, no lesions/ rash   Wt Readings from Last 3 Encounters:  05/07/17 205 lb 14.4 oz (93.4 kg)  04/16/17 205 lb (93 kg)  12/24/16 212 lb 14.4 oz (96.6 kg)    Diabetic Foot Exam - Simple   No data filed     Lab Results  Component Value Date   WBC 6.3 03/31/2013   HGB 16.4 03/31/2013   HCT 49.9 03/31/2013   PLT 383.0  03/31/2013   GLUCOSE 91 03/31/2013   ALT 33 03/31/2013   AST 26 03/31/2013   NA 138 03/31/2013   K 4.2 03/31/2013   CL 104 03/31/2013   CREATININE 1.2 03/31/2013   BUN 17 03/31/2013   CO2 29 03/31/2013   TSH 0.86 03/31/2013    Assessment/Plan:  Neck pain  -likely MSK  - Plan: cyclobenzaprine (FLEXERIL) 10 MG tablet, predniSONE (DELTASONE) 10 MG tablet -NSAIDs prn for pain/discomfort -Given handout with neck exercises -RTC precautions given.  If pain continues consider imaging.  Recheck pulse.

## 2017-05-13 DIAGNOSIS — M545 Low back pain: Secondary | ICD-10-CM | POA: Diagnosis not present

## 2017-05-13 DIAGNOSIS — M542 Cervicalgia: Secondary | ICD-10-CM | POA: Diagnosis not present

## 2017-05-21 ENCOUNTER — Encounter: Payer: BLUE CROSS/BLUE SHIELD | Admitting: Adult Health

## 2017-05-23 ENCOUNTER — Encounter: Payer: Self-pay | Admitting: Adult Health

## 2017-08-20 DIAGNOSIS — H52223 Regular astigmatism, bilateral: Secondary | ICD-10-CM | POA: Diagnosis not present

## 2017-10-02 ENCOUNTER — Encounter: Payer: Self-pay | Admitting: Adult Health

## 2017-10-02 ENCOUNTER — Ambulatory Visit: Payer: BLUE CROSS/BLUE SHIELD | Admitting: Adult Health

## 2017-10-02 VITALS — BP 150/70 | HR 96 | Temp 98.1°F | Ht 74.5 in | Wt 208.0 lb

## 2017-10-02 DIAGNOSIS — Z3009 Encounter for other general counseling and advice on contraception: Secondary | ICD-10-CM | POA: Diagnosis not present

## 2017-10-02 NOTE — Progress Notes (Signed)
Subjective:    Patient ID: Kevin Mcgee, male    DOB: 27-Feb-1971, 47 y.o.   MRN: [id number]  HPI  47 year old male who  has a past medical history of Allergy, Asthma, and Sarcoidosis.  He presents to the clinic today to get a referral for a vasectomy.   Review of Systems See HPI   Past Medical History:  Diagnosis Date  . Allergy   . Asthma   . Sarcoidosis     Social History   Socioeconomic History  . Marital status: Single    Spouse name: Not on file  . Number of children: Not on file  . Years of education: Not on file  . Highest education level: Not on file  Social Needs  . Financial resource strain: Not on file  . Food insecurity - worry: Not on file  . Food insecurity - inability: Not on file  . Transportation needs - medical: Not on file  . Transportation needs - non-medical: Not on file  Occupational History  . Not on file  Tobacco Use  . Smoking status: Former Smoker    Packs/day: 0.25    Types: Cigarettes  . Smokeless tobacco: Never Used  Substance and Sexual Activity  . Alcohol use: Yes    Alcohol/week: 0.0 oz  . Drug use: No  . Sexual activity: Not on file  Other Topics Concern  . Not on file  Social History Narrative  . Not on file    No past surgical history on file.  Family History  Problem Relation Age of Onset  . Hypertension Other     No Known Allergies  Current Outpatient Medications on File Prior to Visit  Medication Sig Dispense Refill  . cetirizine (ZYRTEC) 10 MG tablet Take 10 mg by mouth daily.      . cyclobenzaprine (FLEXERIL) 10 MG tablet Take 1 tablet (10 mg total) by mouth at bedtime. As needed. 10 tablet 0  . fluticasone (FLONASE) 50 MCG/ACT nasal spray Place 2 sprays into both nostrils daily. (Patient taking differently: Place 2 sprays into both nostrils daily as needed. ) 16 g 5  . Multiple Vitamin (MULTIVITAMIN) tablet Take 1 tablet by mouth daily as needed. Reported on 01/31/2016    . Omega-3 Fatty Acids (FISH OIL  PO) Take 1 capsule by mouth daily.    Marland Kitchen. omeprazole (PRILOSEC) 20 MG capsule Take 1 capsule (20 mg total) by mouth daily. (Patient taking differently: Take 20 mg by mouth daily as needed. ) 30 capsule 2  . Albuterol Sulfate (PROAIR RESPICLICK) 108 (90 BASE) MCG/ACT AEPB Inhale 1-2 puffs into the lungs every 6 (six) hours as needed. (Patient not taking: Reported on 05/07/2017) 1 each 3  . predniSONE (DELTASONE) 10 MG tablet Take 4 tabs every morning for 3 days, 3 tabs for 2 days, 2 tabs for 2 days, 1 tab for 1 day. (Patient not taking: Reported on 10/02/2017) 23 tablet 0   No current facility-administered medications on file prior to visit.     BP (!) 150/70 (BP Location: Right Arm, Patient Position: Sitting, Cuff Size: Normal)   Pulse 96   Temp 98.1 F (36.7 C) (Oral)   Ht 6' 2.5" (1.892 m)   Wt 208 lb (94.3 kg)   SpO2 97%   BMI 26.35 kg/m       Objective:   Physical Exam  Constitutional: He is oriented to person, place, and time. He appears well-developed and well-nourished. No distress.  Pulmonary/Chest: No respiratory  distress.  Neurological: He is alert and oriented to person, place, and time.  Skin: Skin is warm and dry. He is not diaphoretic.  Psychiatric: He has a normal mood and affect. His behavior is normal. Judgment and thought content normal.  Nursing note and vitals reviewed.     Assessment & Plan:

## 2017-11-06 DIAGNOSIS — Z3009 Encounter for other general counseling and advice on contraception: Secondary | ICD-10-CM | POA: Diagnosis not present

## 2017-11-21 DIAGNOSIS — Z302 Encounter for sterilization: Secondary | ICD-10-CM | POA: Diagnosis not present

## 2017-11-26 DIAGNOSIS — N50812 Left testicular pain: Secondary | ICD-10-CM | POA: Diagnosis not present

## 2017-12-17 ENCOUNTER — Encounter: Payer: Self-pay | Admitting: Adult Health

## 2017-12-17 ENCOUNTER — Ambulatory Visit: Payer: BLUE CROSS/BLUE SHIELD | Admitting: Adult Health

## 2017-12-17 VITALS — BP 140/82 | Temp 98.1°F | Wt 208.0 lb

## 2017-12-17 DIAGNOSIS — B356 Tinea cruris: Secondary | ICD-10-CM | POA: Diagnosis not present

## 2017-12-17 MED ORDER — NYSTATIN 100000 UNIT/GM EX POWD
Freq: Two times a day (BID) | CUTANEOUS | 3 refills | Status: DC
Start: 2017-12-17 — End: 2018-08-28

## 2017-12-17 NOTE — Progress Notes (Signed)
Subjective:    Patient ID: Kevin Mcgee, male    DOB: 1971/07/27, 47 y.o.   MRN: [id number]  HPI  47 year old male who  has a past medical history of Allergy, Asthma, and Sarcoidosis.  He presents to the office today for rash in his groin. Reports having a vasectomy three weeks ago. About one week ago noticed a rash with in his groin. Since that time rash has been spreading and does report some breaks in the folds of the skin. Does endorse itching. No pain or drainage. Has not noticed an odor.   Review of Systems See HPI   Past Medical History:  Diagnosis Date  . Allergy   . Asthma   . Sarcoidosis     Social History   Socioeconomic History  . Marital status: Single    Spouse name: Not on file  . Number of children: Not on file  . Years of education: Not on file  . Highest education level: Not on file  Occupational History  . Not on file  Social Needs  . Financial resource strain: Not on file  . Food insecurity:    Worry: Not on file    Inability: Not on file  . Transportation needs:    Medical: Not on file    Non-medical: Not on file  Tobacco Use  . Smoking status: Former Smoker    Packs/day: 0.25    Types: Cigarettes  . Smokeless tobacco: Never Used  Substance and Sexual Activity  . Alcohol use: Yes    Alcohol/week: 0.0 oz  . Drug use: No  . Sexual activity: Not on file  Lifestyle  . Physical activity:    Days per week: Not on file    Minutes per session: Not on file  . Stress: Not on file  Relationships  . Social connections:    Talks on phone: Not on file    Gets together: Not on file    Attends religious service: Not on file    Active member of club or organization: Not on file    Attends meetings of clubs or organizations: Not on file    Relationship status: Not on file  . Intimate partner violence:    Fear of current or ex partner: Not on file    Emotionally abused: Not on file    Physically abused: Not on file    Forced sexual activity:  Not on file  Other Topics Concern  . Not on file  Social History Narrative  . Not on file    Past Surgical History:  Procedure Laterality Date  . VASECTOMY      Family History  Problem Relation Age of Onset  . Hypertension Other     No Known Allergies  Current Outpatient Medications on File Prior to Visit  Medication Sig Dispense Refill  . cetirizine (ZYRTEC) 10 MG tablet Take 10 mg by mouth daily.      . fluticasone (FLONASE) 50 MCG/ACT nasal spray Place 2 sprays into both nostrils daily. (Patient taking differently: Place 2 sprays into both nostrils daily as needed. ) 16 g 5  . omeprazole (PRILOSEC) 20 MG capsule Take 1 capsule (20 mg total) by mouth daily. (Patient taking differently: Take 20 mg by mouth daily as needed. ) 30 capsule 2   No current facility-administered medications on file prior to visit.     BP 140/82   Temp 98.1 F (36.7 C) (Oral)   Wt 208 lb (94.3 kg)  BMI 26.35 kg/m       Objective:   Physical Exam  Constitutional: He is oriented to person, place, and time. He appears well-developed and well-nourished. No distress.  Neurological: He is alert and oriented to person, place, and time.  Skin: Skin is warm and dry. Rash noted. He is not diaphoretic.  tinea cruris noted in folds of groin   Psychiatric: He has a normal mood and affect. His behavior is normal. Judgment and thought content normal.  Nursing note and vitals reviewed.     Assessment & Plan:  1. Tinea cruris - nystatin (NYSTATIN) powder; Apply topically 2 (two) times daily.  Dispense: 45 g; Refill: 3 - keep area clean and dry  - Do not wear tight synthetic boxers  - No creams or ointments  - Follow up as needed  Shirline Frees, NP

## 2017-12-22 ENCOUNTER — Telehealth: Payer: Self-pay | Admitting: *Deleted

## 2017-12-22 NOTE — Telephone Encounter (Signed)
Copied from CRM [phone]. Topic: General - Other >> Dec 22, 2017  3:01 PM Elliot GaultBell, Tiffany M wrote: Relation to pt: self  Call back number: [phone]    Reason for call:  Patient states the rash in his groin has not improved and would like to know if PCP can prescribe a stronger Rx or possible suggest a OTC that is stronger then nystatin (NYSTATIN) powder, please advise (patient currently out of town)

## 2017-12-23 NOTE — Telephone Encounter (Signed)
He can use OTC anti fungal cream. It will take longer than 5-6 days to start noticing a difference

## 2017-12-23 NOTE — Telephone Encounter (Signed)
Spoke to the pt and advised he can use an otc anti fungal cream but will take longer that 5-6 days to notice any improvement.  [place]  Advised him to stay cool and dry as much as possible.

## 2018-01-06 ENCOUNTER — Ambulatory Visit (INDEPENDENT_AMBULATORY_CARE_PROVIDER_SITE_OTHER): Payer: BLUE CROSS/BLUE SHIELD | Admitting: Adult Health

## 2018-01-06 ENCOUNTER — Ambulatory Visit
Admission: RE | Admit: 2018-01-06 | Discharge: 2018-01-06 | Disposition: A | Discharge status: Home / Self Care | Payer: BLUE CROSS/BLUE SHIELD | Source: Ambulatory Visit | Attending: Adult Health | Admitting: Adult Health

## 2018-01-06 ENCOUNTER — Encounter: Payer: Self-pay | Admitting: Adult Health

## 2018-01-06 VITALS — BP 120/82 | Temp 97.9°F | Ht 74.0 in | Wt 204.0 lb

## 2018-01-06 DIAGNOSIS — J302 Other seasonal allergic rhinitis: Secondary | ICD-10-CM | POA: Diagnosis not present

## 2018-01-06 DIAGNOSIS — Z125 Encounter for screening for malignant neoplasm of prostate: Secondary | ICD-10-CM | POA: Diagnosis not present

## 2018-01-06 DIAGNOSIS — D869 Sarcoidosis, unspecified: Secondary | ICD-10-CM

## 2018-01-06 DIAGNOSIS — Z0000 Encounter for general adult medical examination without abnormal findings: Secondary | ICD-10-CM

## 2018-01-06 DIAGNOSIS — K219 Gastro-esophageal reflux disease without esophagitis: Secondary | ICD-10-CM | POA: Diagnosis not present

## 2018-01-06 DIAGNOSIS — Z23 Encounter for immunization: Secondary | ICD-10-CM | POA: Diagnosis not present

## 2018-01-06 DIAGNOSIS — D860 Sarcoidosis of lung: Secondary | ICD-10-CM | POA: Diagnosis not present

## 2018-01-06 LAB — CBC WITH DIFFERENTIAL/PLATELET
BASOS PCT: 0.8 % (ref 0.0–3.0)
Basophils Absolute: 0 10*3/uL (ref 0.0–0.1)
EOS PCT: 6.6 % — AB (ref 0.0–5.0)
Eosinophils Absolute: 0.3 10*3/uL (ref 0.0–0.7)
HEMATOCRIT: 45.5 % (ref 39.0–52.0)
HEMOGLOBIN: 15.3 g/dL (ref 13.0–17.0)
Lymphocytes Relative: 27.2 % (ref 12.0–46.0)
Lymphs Abs: 1.2 10*3/uL (ref 0.7–4.0)
MCHC: 33.7 g/dL (ref 30.0–36.0)
MCV: 88.7 fl (ref 78.0–100.0)
MONO ABS: 0.4 10*3/uL (ref 0.1–1.0)
MONOS PCT: 9.9 % (ref 3.0–12.0)
Neutro Abs: 2.4 10*3/uL (ref 1.4–7.7)
Neutrophils Relative %: 55.5 % (ref 43.0–77.0)
Platelets: 398 10*3/uL (ref 150.0–400.0)
RBC: 5.12 Mil/uL (ref 4.22–5.81)
RDW: 13.7 % (ref 11.5–15.5)
WBC: 4.3 10*3/uL (ref 4.0–10.5)

## 2018-01-06 LAB — LIPID PANEL
CHOL/HDL RATIO: 5
CHOLESTEROL: 196 mg/dL (ref 0–200)
HDL: 42.2 mg/dL (ref >39.00)
LDL CALC: 134 mg/dL — AB (ref 0–99)
NonHDL: 153.54
Triglycerides: 100 mg/dL (ref 0.0–149.0)
VLDL: 20 mg/dL (ref 0.0–40.0)

## 2018-01-06 LAB — HEPATIC FUNCTION PANEL
ALT: 37 U/L (ref 0–53)
AST: 25 U/L (ref 0–37)
Albumin: 4.5 g/dL (ref 3.5–5.2)
Alkaline Phosphatase: 61 U/L (ref 39–117)
BILIRUBIN TOTAL: 0.4 mg/dL (ref 0.2–1.2)
Bilirubin, Direct: 0.1 mg/dL (ref 0.0–0.3)
TOTAL PROTEIN: 7.1 g/dL (ref 6.0–8.3)

## 2018-01-06 LAB — BASIC METABOLIC PANEL
BUN: 15 mg/dL (ref 6–23)
CHLORIDE: 102 meq/L (ref 96–112)
CO2: 29 mEq/L (ref 19–32)
Calcium: 9.4 mg/dL (ref 8.4–10.5)
Creatinine, Ser: 1.12 mg/dL (ref 0.40–1.50)
GFR: 90.3 mL/min (ref >60.00)
Glucose, Bld: 92 mg/dL (ref 70–99)
POTASSIUM: 4.5 meq/L (ref 3.5–5.1)
Sodium: 139 mEq/L (ref 135–145)

## 2018-01-06 LAB — TSH: TSH: 0.9 u[IU]/mL (ref 0.35–4.50)

## 2018-01-06 LAB — PSA: PSA: 0.33 ng/mL (ref 0.10–4.00)

## 2018-01-06 MED ORDER — OMEPRAZOLE 20 MG PO CPDR
20.0000 mg | DELAYED_RELEASE_CAPSULE | Freq: Every day | ORAL | 2 refills | Status: DC
Start: 2018-01-06 — End: 2021-10-05

## 2018-01-06 MED ORDER — FLUTICASONE PROPIONATE 50 MCG/ACT NA SUSP: 2.0000 | Freq: Every day | NASAL | 5 refills | Status: AC

## 2018-01-06 NOTE — Progress Notes (Signed)
Subjective:    Patient ID: Kevin Mcgee, male    DOB: [birth year], 47 y.o.   MRN: [id number]  HPI Patient presents for yearly preventative medicine examination. He is a pleasant 47 year old male who  has a past medical history of Allergy, Asthma, and Sarcoidosis.  GERD - Controlled with Prilosec   Seasonal Allergies - Controlled with Zyrtec and Flonase.   Sarcoidosis - Has not had a chest xray since 2007. Denies any signs or symptoms   All immunizations and health maintenance protocols were reviewed with the patient and needed orders were placed. Due for Td  Appropriate screening laboratory values were ordered for the patient including screening of hyperlipidemia, renal function and hepatic function. If indicated by BPH, a PSA was ordered.  Medication reconciliation,  past medical history, social history, problem list and allergies were reviewed in detail with the patient  Goals were established with regard to weight loss, exercise, and  diet in compliance with medications. He stays active, exercises often and eats healthy   He has no acute issues   Review of Systems  Constitutional: Negative.   HENT: Negative.   Eyes: Negative.   Respiratory: Negative.   Cardiovascular: Negative.   Gastrointestinal: Negative.   Endocrine: Negative.   Genitourinary: Negative.   Musculoskeletal: Negative.   Skin: Negative.   Allergic/Immunologic: Negative.   Neurological: Negative.   Hematological: Negative.   Psychiatric/Behavioral: Negative.   All other systems reviewed and are negative.  Past Medical History:  Diagnosis Date  . Allergy   . Asthma   . Sarcoidosis     Social History   Socioeconomic History  . Marital status: Single    Spouse name: Not on file  . Number of children: Not on file  . Years of education: Not on file  . Highest education level: Not on file  Occupational History  . Not on file  Social Needs  . Financial resource strain: Not on file  . Food  insecurity:    Worry: Not on file    Inability: Not on file  . Transportation needs:    Medical: Not on file    Non-medical: Not on file  Tobacco Use  . Smoking status: Former Smoker    Packs/day: 0.25    Types: Cigarettes  . Smokeless tobacco: Never Used  Substance and Sexual Activity  . Alcohol use: Yes    Alcohol/week: 0.0 oz  . Drug use: No  . Sexual activity: Not on file  Lifestyle  . Physical activity:    Days per week: Not on file    Minutes per session: Not on file  . Stress: Not on file  Relationships  . Social connections:    Talks on phone: Not on file    Gets together: Not on file    Attends religious service: Not on file    Active member of club or organization: Not on file    Attends meetings of clubs or organizations: Not on file    Relationship status: Not on file  . Intimate partner violence:    Fear of current or ex partner: Not on file    Emotionally abused: Not on file    Physically abused: Not on file    Forced sexual activity: Not on file  Other Topics Concern  . Not on file  Social History Narrative  . Not on file    Past Surgical History:  Procedure Laterality Date  . VASECTOMY      Family  History  Problem Relation Age of Onset  . Hypertension Other     No Known Allergies  Current Outpatient Medications on File Prior to Visit  Medication Sig Dispense Refill  . cetirizine (ZYRTEC) 10 MG tablet Take 10 mg by mouth daily.      Marland Kitchen nystatin (NYSTATIN) powder Apply topically 2 (two) times daily. 45 g 3   No current facility-administered medications on file prior to visit.     BP 120/82 (BP Location: Left Arm)   Temp 97.9 F (36.6 C) (Oral)   Ht  (1.88 m) Comment: WITH SHOES  Wt 204 lb (92.5 kg)   BMI 26.19 kg/m        Objective:   Physical Exam  Constitutional: He is oriented to person, place, and time. He appears well-developed and well-nourished. No distress.  HENT:  Head: Normocephalic and atraumatic.  Right Ear:  External ear normal.  Left Ear: External ear normal.  Nose: Nose normal.  Mouth/Throat: Oropharynx is clear and moist. No oropharyngeal exudate.  Eyes: Pupils are equal, round, and reactive to light. Conjunctivae and EOM are normal. Right eye exhibits no discharge. Left eye exhibits no discharge. No scleral icterus.  Neck: Normal range of motion. Neck supple. No JVD present. No tracheal deviation present. No thyromegaly present.  Cardiovascular: Normal rate, regular rhythm, normal heart sounds and intact distal pulses. Exam reveals no gallop and no friction rub.  No murmur heard. Pulmonary/Chest: Effort normal. No stridor. No respiratory distress. He has wheezes (trace throughout ). He has no rales. He exhibits no tenderness.  Abdominal: Soft. Bowel sounds are normal. He exhibits no distension and no mass. There is no tenderness. There is no rebound and no guarding. No hernia.  Genitourinary:  Genitourinary Comments: Deferred   Musculoskeletal: Normal range of motion. He exhibits no edema, tenderness or deformity.  Lymphadenopathy:    He has no cervical adenopathy.  Neurological: He is alert and oriented to person, place, and time. He displays normal reflexes. No cranial nerve deficit or sensory deficit. He exhibits normal muscle tone. Coordination normal.  Skin: Skin is warm and dry. Capillary refill takes less than 2 seconds. No rash noted. He is not diaphoretic. No erythema. No pallor.  Psychiatric: He has a normal mood and affect. His behavior is normal. Judgment and thought content normal.  Nursing note and vitals reviewed.     Assessment & Plan:  1. Routine general medical examination at a health care facility - Follow up in one year or sooner if needed - Continue to stay active and exercise  - Basic metabolic panel - CBC with Differential/Platelet - Hepatic function panel - Lipid panel - TSH  2. SARCOIDOSIS, PULMONARY  - DG Chest 2 View; Future  3. Seasonal allergies -  fluticasone (FLONASE) 50 MCG/ACT nasal spray; Place 2 sprays into both nostrils daily.  Dispense: 16 g; Refill: 5 - Gave sample of [place]  4. Gastroesophageal reflux disease without esophagitis  - omeprazole (PRILOSEC) 20 MG capsule; Take 1 capsule (20 mg total) by mouth daily.  Dispense: 30 capsule; Refill: 2  5. Prostate cancer screening  - PSA  6. Need for tetanus booster  - Td : Tetanus/diphtheria >7yo Preservative  Free  Shirline Frees, NP

## 2018-01-06 NOTE — Patient Instructions (Signed)
I will follow up with you regarding your blood work   Please go to [facility] on [address] for your chest xray   Follow up as needed

## 2018-02-24 ENCOUNTER — Ambulatory Visit: Payer: BLUE CROSS/BLUE SHIELD | Admitting: Family Medicine

## 2018-02-24 ENCOUNTER — Encounter: Payer: Self-pay | Admitting: Family Medicine

## 2018-02-24 VITALS — BP 118/78 | HR 99 | Temp 97.9°F | Ht 74.0 in | Wt 204.6 lb

## 2018-02-24 DIAGNOSIS — H6691 Otitis media, unspecified, right ear: Secondary | ICD-10-CM

## 2018-02-24 MED ORDER — PREDNISONE 50 MG PO TABS: ORAL_TABLET | ORAL | 0 refills | Status: DC

## 2018-02-24 MED ORDER — IPRATROPIUM BROMIDE 0.06 % NA SOLN: 2.0000 | Freq: Four times a day (QID) | NASAL | 0 refills | Status: DC

## 2018-02-24 MED ORDER — AMOXICILLIN 875 MG PO TABS: 875.0000 mg | ORAL_TABLET | Freq: Two times a day (BID) | ORAL | 0 refills | Status: DC

## 2018-02-24 NOTE — Patient Instructions (Signed)
It was very nice to see you today!  You have an ear infection.  Please start the amoxicillin, prednisone, and Atrovent.  You can also use Tylenol and/or Motrin as needed for pain and fever.  Please let me know if your symptoms worsen or do not improve over the next few days.  Take care, Dr Jimmey RalphParker

## 2018-02-24 NOTE — Progress Notes (Addendum)
   Subjective:  Kevin Mcgee is a 47 y.o. male who presents today for same-day appointment with a chief complaint of right ear pain.   HPI:  Right Ear Pain, acute problem Symptoms started last week with rhinorrhea, cough, and sputum production.  Over the last 1 to 2 days symptoms are worse than he has had worsening right ear pain.  He has tried using Debrox his right ear which has not helped.  No fevers or chills.  He has had some decreased hearing in that ear as well.  No other obvious alleviating or aggravating factors.  ROS: Per HPI  PMH: He reports that he has quit smoking. His smoking use included cigarettes. He smoked 0.25 packs per day. He has never used smokeless tobacco. He reports that he drinks alcohol. He reports that he does not use drugs.  Objective:  Physical Exam: BP 118/78 (BP Location: Left Arm, Patient Position: Sitting, Cuff Size: Normal)   Pulse 99   Temp 97.9 F (36.6 C) (Oral)   Ht 6\' 2"  (1.88 m)   Wt 204 lb 9.6 oz (92.8 kg)   SpO2 97%   BMI 26.27 kg/m   Gen: NAD, resting comfortably HEENT: Right EAC with moderate amount of cerumen that was successfully removed via curette. Right TM bulging and erythematous.  Left TM clear.  OP mildly erythematous without exudate.  Nasal mucosa erythematous and boggy bilaterally. CV: RRR with no murmurs appreciated Pulm: NWOB, CTAB with occasional rhonchi that clears with cough.  Assessment/Plan:  Acute otitis media Start course of amoxicillin and prednisone for his otitis media.  Start Atrovent for his rhinorrhea.  Encouraged Tylenol and/or Motrin as needed.  Encouraged good oral hydration.  Discussed reasons return to care.  Follow-up as needed.  Katina Degreealeb M. Jimmey RalphParker, MD 02/24/2018 3:13 PM

## 2018-03-02 ENCOUNTER — Ambulatory Visit: Payer: Self-pay | Admitting: Adult Health

## 2018-03-02 NOTE — Telephone Encounter (Signed)
Dr Fabian SharpPanosh please advise if you have any recommendations or if you feel the patient needs to come back in and be seen with someone this week to follow up on his ears. Thanks.

## 2018-03-02 NOTE — Telephone Encounter (Signed)
If no fever and pain is better   Can  Wait. To be seen and f=give it more time     Sometimes it takes 2-4 weeks to get hearing back but if not can make appt with  pcp or  Kevin Mcgee

## 2018-03-02 NOTE — Telephone Encounter (Signed)
Pt reports seen by Dr. Jimmey RalphParker 02/24/18 for earache right ear ..'otitis media.' States he finished course of prednisone and has 1 day left on amoxicillin. States ear feels better but "Still feels clogged." States overall better but has been taking "A lot of ibuprofen." Questioning if he should be seen by his PCP or "give it more time." Denies fever. Please advise: [phone]  Reason for Disposition . [1] Taking antibiotic > 72 hours (3 days) AND [2] symptoms (other than fever) not improved  Answer Assessment - Initial Assessment Questions 1. INFECTION: "What infection is the antibiotic being given for?"     Earache 2. ANTIBIOTIC: "What antibiotic are you taking" "How many times per day?"     Amoxicillin. 3. DURATION: "When was the antibiotic started?"     02/25/18 4. MAIN CONCERN OR SYMPTOM:  "What is your main concern right now?"     Symptoms remain, "Overall better"  but ear still feels clogged 5. BETTER-SAME-WORSE: "Are you getting better, staying the same, or getting worse compared to when you first started the antibiotics?" If getting worse, ask: "In what way?"      Better but not resolved 6. FEVER: "Do you have a fever?" If so, ask: "What is your temperature, how was it measured, and when did it start?"     no 7. SYMPTOMS: "Are there any other symptoms you're concerned about?" If so, ask: "When did it start?"     no 8. FOLLOW-UP APPOINTMENT: "Do you have a follow-up appointment with your doctor?"     no  Protocols used: INFECTION ON ANTIBIOTIC FOLLOW-UP CALL-A-AH

## 2018-03-02 NOTE — Telephone Encounter (Signed)
I left a voice message for pt to return my call.  

## 2018-03-04 NOTE — Telephone Encounter (Signed)
I spoke with pt, he will give it more time, if not improved by [facility]

## 2018-03-28 ENCOUNTER — Other Ambulatory Visit: Payer: Self-pay | Admitting: Family Medicine

## 2018-03-29 DIAGNOSIS — J060 Acute laryngopharyngitis: Secondary | ICD-10-CM | POA: Diagnosis not present

## 2018-03-29 DIAGNOSIS — J302 Other seasonal allergic rhinitis: Secondary | ICD-10-CM | POA: Insufficient documentation

## 2018-03-29 DIAGNOSIS — B9689 Other specified bacterial agents as the cause of diseases classified elsewhere: Secondary | ICD-10-CM | POA: Diagnosis not present

## 2018-03-29 DIAGNOSIS — J4530 Mild persistent asthma, uncomplicated: Secondary | ICD-10-CM | POA: Insufficient documentation

## 2018-03-29 DIAGNOSIS — J0141 Acute recurrent pansinusitis: Secondary | ICD-10-CM | POA: Diagnosis not present

## 2018-03-29 DIAGNOSIS — J208 Acute bronchitis due to other specified organisms: Secondary | ICD-10-CM | POA: Diagnosis not present

## 2018-05-30 DIAGNOSIS — R51 Headache: Secondary | ICD-10-CM | POA: Diagnosis not present

## 2018-06-23 DIAGNOSIS — M542 Cervicalgia: Secondary | ICD-10-CM | POA: Diagnosis not present

## 2018-07-11 IMAGING — CR DG CHEST 2V
2 series · 2 of 2 positions shown · non-contrast
Comparison: 10/23/2005

CLINICAL DATA: History of sarcoidosis

EXAM:
CHEST - 2 VIEW

[w chest pa]
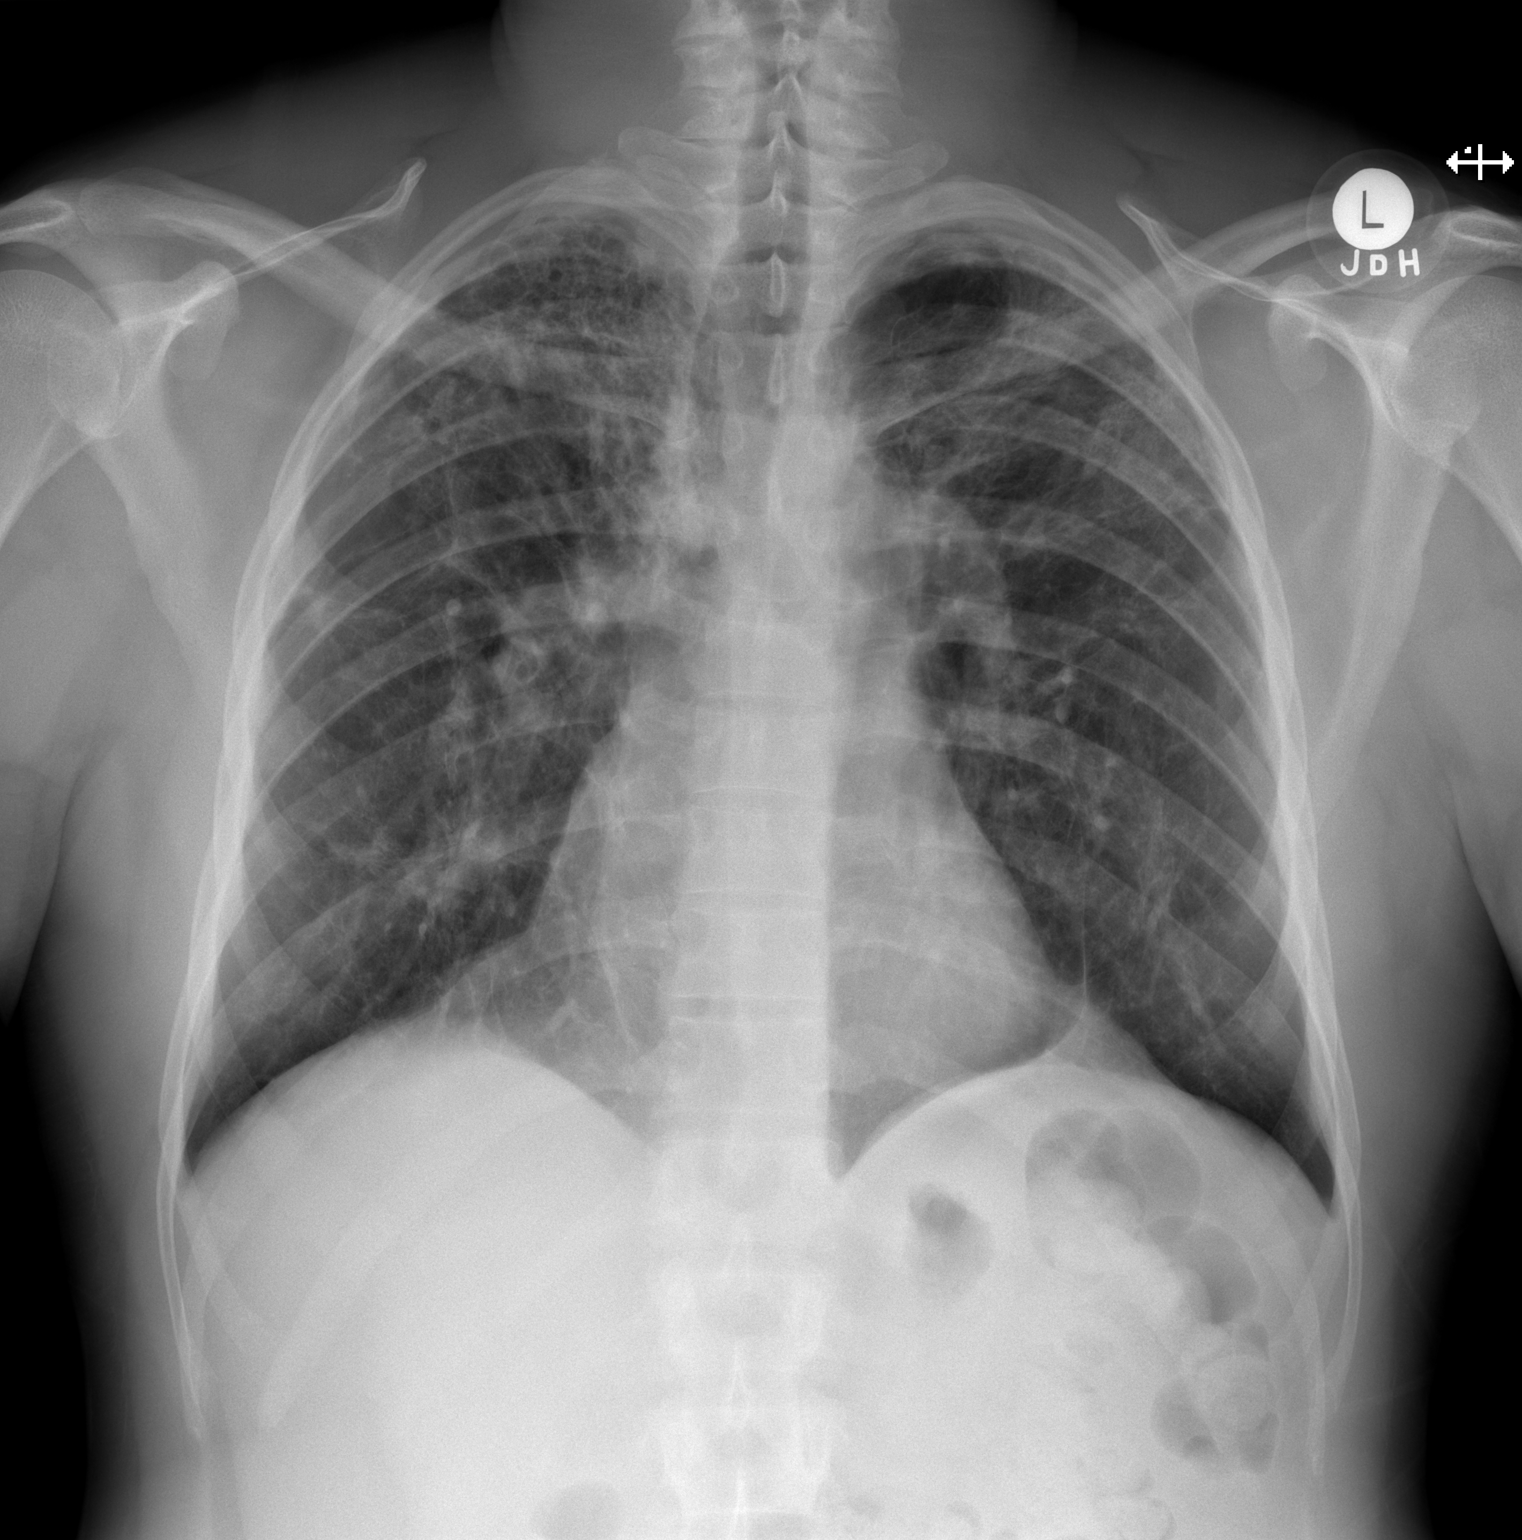

[w chest lat]
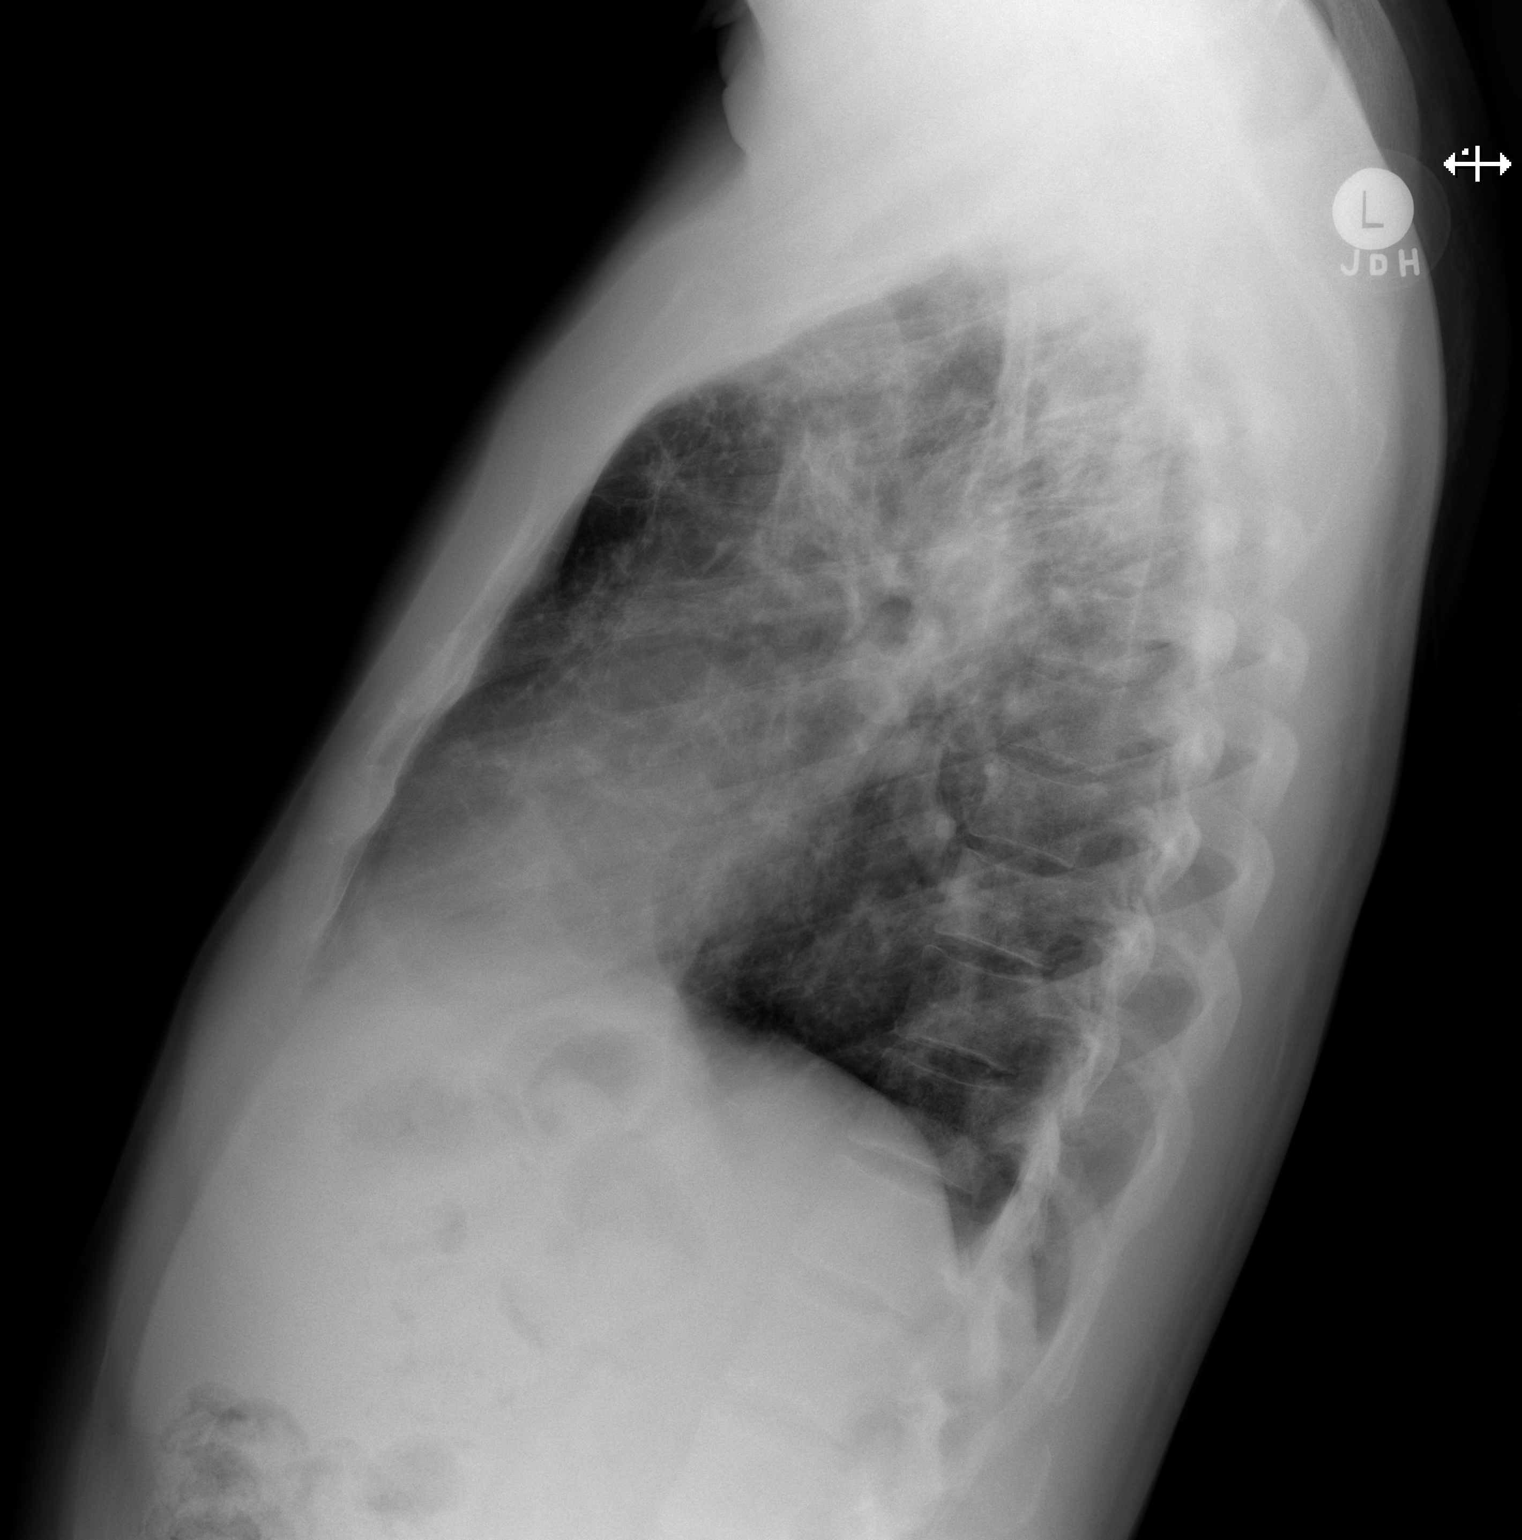

[2 of 2 positions shown; findings below may reference images not displayed]

FINDINGS: Cardiac shadow is stable. Persistent scarring is noted in both lungs
stable from the prior exam. No focal confluent infiltrate or sizable
effusion is seen. No acute bony abnormality is noted.
IMPRESSION: Stable appearing changes consistent with sarcoidosis.

## 2018-08-27 ENCOUNTER — Ambulatory Visit: Payer: Self-pay

## 2018-08-27 NOTE — Telephone Encounter (Signed)
Pt c/o swollen right ankle. Pt stated he noted the swelling over the weekend. Pt stated that he has not twisted or sprained it. Pt stated that he can walk on the ankle without pain. Pt stated that the ankle does not feel warmer than the left ankle. Pt denies calf pain. Pt denies fever, chest pain, difficulty breathing.  Care advice given to pt and pt verbalized understanding. Pt given appt with PCP tomorrow.  Reason for Disposition . Swollen ankle joint  (Exception: area of localized swelling which is itchy)  Answer Assessment - Initial Assessment Questions 1. LOCATION: "Which joint is swollen?"     Right ankle 2. ONSET: "When did the swelling start?"     Last Friday 3. SIZE: "How large is the swelling?"     Right lateral  4. PAIN: "Is there any pain?" If so, ask: "How bad is it?" (Scale 1-10; or mild, moderate, severe) Pain to touch pt stated it does not hurt to stand on it 5. CAUSE: "What do you think caused the swollen joint?"     Pt doesn't know 6. OTHER SYMPTOMS: "Do you have any other symptoms?" (e.g., fever, chest pain, difficulty breathing, calf pain)     no 7. PREGNANCY: "Is there any chance you are pregnant?" "When was your last menstrual period?"     n/a  Protocols used: ANKLE SWELLING-A-AH

## 2018-08-28 ENCOUNTER — Encounter: Payer: Self-pay | Admitting: Adult Health

## 2018-08-28 ENCOUNTER — Ambulatory Visit (INDEPENDENT_AMBULATORY_CARE_PROVIDER_SITE_OTHER): Payer: BLUE CROSS/BLUE SHIELD

## 2018-08-28 ENCOUNTER — Ambulatory Visit: Payer: BLUE CROSS/BLUE SHIELD | Admitting: Adult Health

## 2018-08-28 VITALS — BP 128/70 | HR 100 | Temp 98.4°F | Resp 16 | Ht 74.0 in | Wt 207.0 lb

## 2018-08-28 DIAGNOSIS — M79671 Pain in right foot: Secondary | ICD-10-CM

## 2018-08-28 DIAGNOSIS — B356 Tinea cruris: Secondary | ICD-10-CM | POA: Diagnosis not present

## 2018-08-28 MED ORDER — NYSTATIN 100000 UNIT/GM EX POWD: Freq: Two times a day (BID) | CUTANEOUS | 3 refills | Status: DC

## 2018-08-28 MED ORDER — TRIAMCINOLONE ACETONIDE 0.025 % EX OINT: 1.0000 "application " | TOPICAL_OINTMENT | Freq: Two times a day (BID) | CUTANEOUS | 0 refills | Status: DC

## 2018-08-28 NOTE — Progress Notes (Signed)
Subjective:    Patient ID: Kevin DutyRonald G Salamone, male    DOB: 1970/11/13, 47 y.o.   MRN: [id number]  HPI 47 year old male who  has a past medical history of Allergy, Asthma, and Sarcoidosis.  He presents to the office today with the acute complaint of right ankle/foot swelling and pain.  Symptoms have been present for approximately 1 week.  He denies any injury or trauma.   Review of Systems See HPI   Past Medical History:  Diagnosis Date  . Allergy   . Asthma   . Sarcoidosis     Social History   Socioeconomic History  . Marital status: Single    Spouse name: Not on file  . Number of children: Not on file  . Years of education: Not on file  . Highest education level: Not on file  Occupational History  . Not on file  Social Needs  . Financial resource strain: Not on file  . Food insecurity:    Worry: Not on file    Inability: Not on file  . Transportation needs:    Medical: Not on file    Non-medical: Not on file  Tobacco Use  . Smoking status: Former Smoker    Packs/day: 0.25    Types: Cigarettes  . Smokeless tobacco: Never Used  Substance and Sexual Activity  . Alcohol use: Yes    Alcohol/week: 0.0 standard drinks  . Drug use: No  . Sexual activity: Not on file  Lifestyle  . Physical activity:    Days per week: Not on file    Minutes per session: Not on file  . Stress: Not on file  Relationships  . Social connections:    Talks on phone: Not on file    Gets together: Not on file    Attends religious service: Not on file    Active member of club or organization: Not on file    Attends meetings of clubs or organizations: Not on file    Relationship status: Not on file  . Intimate partner violence:    Fear of current or ex partner: Not on file    Emotionally abused: Not on file    Physically abused: Not on file    Forced sexual activity: Not on file  Other Topics Concern  . Not on file  Social History Narrative  . Not on file    Past Surgical  History:  Procedure Laterality Date  . VASECTOMY      Family History  Problem Relation Age of Onset  . Hypertension Other     No Known Allergies  Current Outpatient Medications on File Prior to Visit  Medication Sig Dispense Refill  . cetirizine (ZYRTEC) 10 MG tablet Take 10 mg by mouth daily.      . fluticasone (FLONASE) 50 MCG/ACT nasal spray Place 2 sprays into both nostrils daily. 16 g 5  . omeprazole (PRILOSEC) 20 MG capsule Take 1 capsule (20 mg total) by mouth daily. 30 capsule 2  . predniSONE (DELTASONE) 50 MG tablet Take 1 tablet daily for 5 days. 5 tablet 0   No current facility-administered medications on file prior to visit.     BP 128/70 (BP Location: Left Arm, Patient Position: Sitting, Cuff Size: Normal)   Pulse 100   Temp 98.4 F (36.9 C) (Oral)   Resp 16   Ht 6\' 2"  (1.88 m)   Wt 207 lb (93.9 kg)   SpO2 98%   BMI 26.58 kg/m  Objective:   Physical Exam Vitals signs and nursing note reviewed.  Constitutional:      Appearance: Normal appearance.  Neck:     Musculoskeletal: Normal range of motion and neck supple.  Musculoskeletal: Normal range of motion.        General: Swelling and tenderness present. No deformity or signs of injury.     Right lower leg: No edema.     Left lower leg: No edema.     Comments: Very mild colitis swelling and tenderness with palpation along cuboid bone and talus of right foot. no bruising noted.  Skin:    General: Skin is warm and dry.     Capillary Refill: Capillary refill takes less than 2 seconds.  Neurological:     General: No focal deficit present.     Mental Status: He is alert and oriented to person, place, and time.  Psychiatric:        Mood and Affect: Mood normal.        Behavior: Behavior normal.        Thought Content: Thought content normal.        Judgment: Judgment normal.       Assessment & Plan:  1. Right foot pain - likely bruise but will get xray to r/o stress fracture - DG Foot Complete  Right; Future  Shirline Freesory Tibor Lemmons, NP

## 2018-09-18 DIAGNOSIS — H52223 Regular astigmatism, bilateral: Secondary | ICD-10-CM | POA: Diagnosis not present

## 2019-05-25 ENCOUNTER — Encounter: Payer: Self-pay | Admitting: Adult Health

## 2019-05-25 ENCOUNTER — Other Ambulatory Visit: Payer: Self-pay

## 2019-05-25 ENCOUNTER — Ambulatory Visit: Payer: BC Managed Care – PPO | Admitting: Adult Health

## 2019-05-25 ENCOUNTER — Ambulatory Visit: Payer: Self-pay | Admitting: *Deleted

## 2019-05-25 VITALS — BP 118/60 | HR 99 | Temp 98.0°F | Ht 74.0 in | Wt 212.0 lb

## 2019-05-25 DIAGNOSIS — Z23 Encounter for immunization: Secondary | ICD-10-CM | POA: Diagnosis not present

## 2019-05-25 DIAGNOSIS — T148XXA Other injury of unspecified body region, initial encounter: Secondary | ICD-10-CM

## 2019-05-25 MED ORDER — TIZANIDINE HCL 4 MG PO CAPS: 4.0000 mg | ORAL_CAPSULE | Freq: Every day | ORAL | 0 refills | Status: DC

## 2019-05-25 NOTE — Telephone Encounter (Signed)
Patient has sharp pain with exhalation or stretching movement to relieve pain. Patient reports pain started yesterday- patient took ibuprofen over night- slightly more pain than yesterday. Patient states the pain is associated with inhalation and exhalation. Patient does have hx sarcoidosis. Per protocol- with chest pain protocol is ED- patient is requesting to be seen in office- per request- note sent to [facility] for review. Patient can be reached at [phone]. Patient advised go to ED for any changes- more severe pain.  Reason for Disposition . [1] Chest pain lasts > 5 minutes AND [2] age > 56  Answer Assessment - Initial Assessment Questions 1. LOCATION: "Where does it hurt?"       R sided chest pain 2. RADIATION: "Does the pain go anywhere else?" (e.g., into neck, jaw, arms, back)     When moving- he may feel it- mainly stationary 3. ONSET: "When did the chest pain begin?" (Minutes, hours or days)      yesterday 4. PATTERN "Does the pain come and go, or has it been constant since it started?"  "Does it get worse with exertion?"      Constant- no 5. DURATION: "How long does it last" (e.g., seconds, minutes, hours)     Constant since started- feels when inhales 6. SEVERITY: "How bad is the pain?"  (e.g., Scale 1-10; mild, moderate, or severe)    - MILD (1-3): doesn't interfere with normal activities     - MODERATE (4-7): interferes with normal activities or awakens from sleep    - SEVERE (8-10): excruciating pain, unable to do any normal activities       mild 7. CARDIAC RISK FACTORS: "Do you have any history of heart problems or risk factors for heart disease?" (e.g., angina, prior heart attack; diabetes, high blood pressure, high cholesterol, smoker, or strong family history of heart disease)     no 8. PULMONARY RISK FACTORS: "Do you have any history of lung disease?"  (e.g., blood clots in lung, asthma, emphysema, birth control pills)     Previous smoker, hx sarcoidosis  9. CAUSE: "What do  you think is causing the chest pain?"     Feels like something that has happened before 10. OTHER SYMPTOMS: "Do you have any other symptoms?" (e.g., dizziness, nausea, vomiting, sweating, fever, difficulty breathing, cough)       No 11. PREGNANCY: "Is there any chance you are pregnant?" "When was your last menstrual period?"       n/a  Protocols used: CHEST PAIN-A-AH

## 2019-05-25 NOTE — Telephone Encounter (Signed)
Pt has been scheduled for today

## 2019-05-25 NOTE — Progress Notes (Signed)
Subjective:    Patient ID: Kevin Mcgee, male    DOB: October 05, 1970, 48 y.o.   MRN: [id number]  HPI 48 year old male who  has a past medical history of Allergy, Asthma, and Sarcoidosis.  He presents to the office today for chest pain. He reports that his pain started yesterday and is described as " sharp" and is associated with deep breaths and certain movements such as bending, twisting, and rotating his neck towards his left shoulder.  Pain is located in the right upper chest and radiates under his axilla to his right upper back and neck.  He denies aggravating injury.  Denies left-sided chest pain, numbness or tingling in his arms or hands.  He has not experienced blurred vision, headaches, or shortness of breath  He is tried Tylenol Motrin without relief  Review of Systems See HPI   Past Medical History:  Diagnosis Date  . Allergy   . Asthma   . Sarcoidosis     Social History   Socioeconomic History  . Marital status: Single    Spouse name: Not on file  . Number of children: Not on file  . Years of education: Not on file  . Highest education level: Not on file  Occupational History  . Not on file  Social Needs  . Financial resource strain: Not on file  . Food insecurity    Worry: Not on file    Inability: Not on file  . Transportation needs    Medical: Not on file    Non-medical: Not on file  Tobacco Use  . Smoking status: Former Smoker    Packs/day: 0.25    Types: Cigarettes  . Smokeless tobacco: Never Used  Substance and Sexual Activity  . Alcohol use: Yes    Alcohol/week: 0.0 standard drinks  . Drug use: No  . Sexual activity: Not on file  Lifestyle  . Physical activity    Days per week: Not on file    Minutes per session: Not on file  . Stress: Not on file  Relationships  . Social Herbalist on phone: Not on file    Gets together: Not on file    Attends religious service: Not on file    Active member of club or organization: Not on file     Attends meetings of clubs or organizations: Not on file    Relationship status: Not on file  . Intimate partner violence    Fear of current or ex partner: Not on file    Emotionally abused: Not on file    Physically abused: Not on file    Forced sexual activity: Not on file  Other Topics Concern  . Not on file  Social History Narrative  . Not on file    Past Surgical History:  Procedure Laterality Date  . VASECTOMY      Family History  Problem Relation Age of Onset  . Hypertension Other     No Known Allergies  Current Outpatient Medications on File Prior to Visit  Medication Sig Dispense Refill  . cetirizine (ZYRTEC) 10 MG tablet Take 10 mg by mouth daily.      . fluticasone (FLONASE) 50 MCG/ACT nasal spray Place 2 sprays into both nostrils daily. 16 g 5  . nystatin (NYSTATIN) powder Apply topically 2 (two) times daily. 45 g 3  . omeprazole (PRILOSEC) 20 MG capsule Take 1 capsule (20 mg total) by mouth daily. 30 capsule 2  .  predniSONE (DELTASONE) 50 MG tablet Take 1 tablet daily for 5 days. 5 tablet 0  . triamcinolone (KENALOG) 0.025 % ointment Apply 1 application topically 2 (two) times daily. 30 g 0   No current facility-administered medications on file prior to visit.     BP 118/60 (BP Location: Left Arm, Patient Position: Sitting, Cuff Size: Normal)   Pulse 99   Temp 98 F (36.7 C) (Oral)   Ht 6\' 2"  (1.88 m)   Wt 212 lb (96.2 kg)   SpO2 96%   BMI 27.22 kg/m       Objective:   Physical Exam Vitals signs and nursing note reviewed.  Constitutional:      Appearance: Normal appearance.  HENT:     Head: Normocephalic and atraumatic.     Right Ear: Tympanic membrane, ear canal and external ear normal.     Left Ear: Tympanic membrane, ear canal and external ear normal.  Cardiovascular:     Rate and Rhythm: Normal rate and regular rhythm.     Pulses: Normal pulses.     Heart sounds: Normal heart sounds.  Pulmonary:     Effort: Pulmonary effort is normal.      Breath sounds: Normal breath sounds.  Musculoskeletal: Normal range of motion.        General: Tenderness (He has some mild tenderness throughout his left skin scapular region as well as under his right axilla.  No chest wall pain.) present. No swelling.  Skin:    Capillary Refill: Capillary refill takes less than 2 seconds.  Neurological:     General: No focal deficit present.     Mental Status: He is alert and oriented to person, place, and time.  Psychiatric:        Mood and Affect: Mood normal.        Behavior: Behavior normal.        Thought Content: Thought content normal.        Judgment: Judgment normal.       Assessment & Plan:  1. Muscle strain - Continue with tylenol/motrin. Add heating pad and gentle stretching exercises - tiZANidine (ZANAFLEX) 4 MG capsule; Take 1 capsule (4 mg total) by mouth at bedtime.  Dispense: 10 capsule; Refill: 0  2. Need for immunization against influenza  - Flu Vaccine QUAD 36+ mos IM   , NP

## 2019-05-25 NOTE — Telephone Encounter (Signed)
Please advise. Pt refusing ED and wants to see you. Thanks!

## 2019-05-25 NOTE — Telephone Encounter (Signed)
I can see him in the office.

## 2019-06-25 ENCOUNTER — Encounter: Payer: Self-pay | Admitting: Adult Health

## 2019-06-25 NOTE — Telephone Encounter (Signed)
NOTED

## 2019-07-06 ENCOUNTER — Encounter: Payer: BC Managed Care – PPO | Admitting: Adult Health

## 2019-09-21 ENCOUNTER — Encounter: Payer: Self-pay | Admitting: Adult Health

## 2019-10-20 ENCOUNTER — Encounter: Payer: Self-pay | Admitting: Adult Health

## 2019-10-20 NOTE — Telephone Encounter (Signed)
Pt has been scheduled.  °

## 2019-10-21 ENCOUNTER — Telehealth (INDEPENDENT_AMBULATORY_CARE_PROVIDER_SITE_OTHER): Payer: BC Managed Care – PPO | Admitting: Family Medicine

## 2019-10-21 ENCOUNTER — Encounter: Payer: Self-pay | Admitting: Adult Health

## 2019-10-21 ENCOUNTER — Other Ambulatory Visit: Payer: Self-pay

## 2019-10-21 DIAGNOSIS — J309 Allergic rhinitis, unspecified: Secondary | ICD-10-CM | POA: Diagnosis not present

## 2019-10-21 DIAGNOSIS — K130 Diseases of lips: Secondary | ICD-10-CM | POA: Diagnosis not present

## 2019-10-21 DIAGNOSIS — H9203 Otalgia, bilateral: Secondary | ICD-10-CM

## 2019-10-21 NOTE — Progress Notes (Signed)
Virtual Visit via Video Note  I connected with Kevin Mcgee  on 10/21/19 at 11:00 AM EST by a video enabled telemedicine application and verified that I am speaking with the correct person using two identifiers.  Location patient: home Location provider:work or home office Persons participating in the virtual visit: patient, provider  I discussed the limitations of evaluation and management by telemedicine and the availability of in person appointments. The patient expressed understanding and agreed to proceed.   HPI:  Acute visit for cracks on lips: -started about 1 week ago -xracks corner of mouth -has had in the past  intermittently and pet jelly helped, but then comes back and this time not going away -denies ulcers, canker sores  He has another issues too - ear discomfort: -he has had clogged ears in the past with wax and also ETD, report uses ear cleaning drops and keeps ears clean now so he does not think this is wax -he has had this sensation for a few weeks -feels like ears are closed -no pulsatile tin, hearing loss, cough, congestion, pain, fever, malaise, sick exposures -he does have allergies -uses zyrtec and flonase when dealing with allergies  -not around people outside of household, reports"secluded" due to the pandemic -has some sinus issues chronically   ROS: See pertinent positives and negatives per HPI.  Past Medical History:  Diagnosis Date  . Allergy   . Asthma   . Sarcoidosis     Past Surgical History:  Procedure Laterality Date  . VASECTOMY      Family History  Problem Relation Age of Onset  . Hypertension Other     SOCIAL HX: see hpi   Current Outpatient Medications:  .  cetirizine (ZYRTEC) 10 MG tablet, Take 10 mg by mouth daily.  , Disp: , Rfl:  .  fluticasone (FLONASE) 50 MCG/ACT nasal spray, Place 2 sprays into both nostrils daily., Disp: 16 g, Rfl: 5 .  nystatin (NYSTATIN) powder, Apply topically 2 (two) times daily., Disp: 45 g, Rfl: 3 .   omeprazole (PRILOSEC) 20 MG capsule, Take 1 capsule (20 mg total) by mouth daily., Disp: 30 capsule, Rfl: 2 .  predniSONE (DELTASONE) 50 MG tablet, Take 1 tablet daily for 5 days., Disp: 5 tablet, Rfl: 0 .  tiZANidine (ZANAFLEX) 4 MG capsule, Take 1 capsule (4 mg total) by mouth at bedtime., Disp: 10 capsule, Rfl: 0 .  triamcinolone (KENALOG) 0.025 % ointment, Apply 1 application topically 2 (two) times daily., Disp: 30 g, Rfl: 0  EXAM:  VITALS per patient if applicable: denies fevers  GENERAL: alert, oriented, appears well and in no acute distress  HEENT: atraumatic, conjunttiva clear, no obvious abnormalities on inspection of external nose and ears, moist mucus membranes as visible, small areas of erythema and mild cracking at the corners of the lips  NECK: normal movements of the head and neck  LUNGS: on inspection no signs of respiratory distress, breathing rate appears normal, no obvious gross SOB, gasping or wheezing  CV: no obvious cyanosis  MS: moves all visible extremities without noticeable abnormality  PSYCH/NEURO: pleasant and cooperative, no obvious depression or anxiety, speech and thought processing grossly intact  ASSESSMENT AND PLAN:  Discussed the following assessment and plan: -we discussed possible serious and likely etiologies, options for evaluation and workup, limitations of telemedicine visit vs in person visit, treatment, treatment risks and precautions. Pt prefers to treat via telemedicine empirically rather then risking or undertaking an in person visit at this moment. Patient agrees to  seek prompt in person care if worsening, new symptoms arise, or if is not improving with treatment.  Angular cheilitis -query yeast vs other -trial clotrimazole twice daily, healthy low sugar diet -follow up if does not resolve  Allergic rhinitis, unspecified seasonality, unspecified trigger -consistent use of zyrtec and flonase as may be contributing to the ear issue -be  alert for other causes of sinus issues, consider covid testing - especially if new symptoms from baseline or any possible exposures, etc  Ear discomfort, bilateral - query ETD vs cerumen impaction vs other -trial flonase, zyrtec, nasal saline, massage -advised inperson eval if worsens or persists     I discussed the assessment and treatment plan with the patient. The patient was provided an opportunity to ask questions and all were answered. The patient agreed with the plan and demonstrated an understanding of the instructions.   The patient was advised to call back or seek an in-person evaluation if the symptoms worsen or if the condition fails to improve as anticipated.   Terressa Koyanagi, DO

## 2019-10-23 ENCOUNTER — Encounter: Payer: Self-pay | Admitting: Adult Health

## 2019-10-25 DIAGNOSIS — L2089 Other atopic dermatitis: Secondary | ICD-10-CM | POA: Diagnosis not present

## 2019-10-25 DIAGNOSIS — L080 Pyoderma: Secondary | ICD-10-CM | POA: Diagnosis not present

## 2019-10-25 DIAGNOSIS — L110 Acquired keratosis follicularis: Secondary | ICD-10-CM | POA: Diagnosis not present

## 2019-10-25 DIAGNOSIS — K130 Diseases of lips: Secondary | ICD-10-CM | POA: Diagnosis not present

## 2019-10-25 DIAGNOSIS — L738 Other specified follicular disorders: Secondary | ICD-10-CM | POA: Diagnosis not present

## 2019-10-25 NOTE — Telephone Encounter (Signed)
Please advise  Tried to call pt to get more information but no answer

## 2019-11-08 ENCOUNTER — Other Ambulatory Visit: Payer: Self-pay

## 2019-11-09 ENCOUNTER — Ambulatory Visit (INDEPENDENT_AMBULATORY_CARE_PROVIDER_SITE_OTHER): Payer: BC Managed Care – PPO | Admitting: Adult Health

## 2019-11-09 ENCOUNTER — Encounter: Payer: Self-pay | Admitting: Adult Health

## 2019-11-09 VITALS — BP 118/70 | Temp 98.0°F | Wt 215.0 lb

## 2019-11-09 DIAGNOSIS — H6983 Other specified disorders of Eustachian tube, bilateral: Secondary | ICD-10-CM | POA: Diagnosis not present

## 2019-11-09 DIAGNOSIS — H6122 Impacted cerumen, left ear: Secondary | ICD-10-CM | POA: Diagnosis not present

## 2019-11-09 DIAGNOSIS — H6993 Unspecified Eustachian tube disorder, bilateral: Secondary | ICD-10-CM

## 2019-11-09 NOTE — Progress Notes (Signed)
Subjective:    Patient ID: Kevin Mcgee, male    DOB: [birth year], 49 y.o.   MRN: [id number]  HPI 49 year old male who  has a past medical history of Allergy, Asthma, and Sarcoidosis.  Presents to the office today for an acute on chronic issue of bilateral ear fullness.  Patient reports feeling of water "swishing around" in his ears as well as decreased hearing.  He does have a history of seasonal allergies and is currently using Flonase as well as Zyrtec.  Symptoms are almost seasonally  Denies fevers, chills, sinus pain or pressure.   Review of Systems See HPI   Past Medical History:  Diagnosis Date  . Allergy   . Asthma   . Sarcoidosis     Social History   Socioeconomic History  . Marital status: Single    Spouse name: Not on file  . Number of children: Not on file  . Years of education: Not on file  . Highest education level: Not on file  Occupational History  . Not on file  Tobacco Use  . Smoking status: Former Smoker    Packs/day: 0.25    Types: Cigarettes  . Smokeless tobacco: Never Used  Substance and Sexual Activity  . Alcohol use: Yes    Alcohol/week: 0.0 standard drinks  . Drug use: No  . Sexual activity: Not on file  Other Topics Concern  . Not on file  Social History Narrative  . Not on file   Social Determinants of Health   Financial Resource Strain:   . Difficulty of Paying Living Expenses: Not on file  Food Insecurity:   . Worried About Programme researcher, broadcasting/film/video in the Last Year: Not on file  . Ran Out of Food in the Last Year: Not on file  Transportation Needs:   . Lack of Transportation (Medical): Not on file  . Lack of Transportation (Non-Medical): Not on file  Physical Activity:   . Days of Exercise per Week: Not on file  . Minutes of Exercise per Session: Not on file  Stress:   . Feeling of Stress : Not on file  Social Connections:   . Frequency of Communication with Friends and Family: Not on file  . Frequency of Social Gatherings  with Friends and Family: Not on file  . Attends Religious Services: Not on file  . Active Member of Clubs or Organizations: Not on file  . Attends Banker Meetings: Not on file  . Marital Status: Not on file  Intimate Partner Violence:   . Fear of Current or Ex-Partner: Not on file  . Emotionally Abused: Not on file  . Physically Abused: Not on file  . Sexually Abused: Not on file    Past Surgical History:  Procedure Laterality Date  . VASECTOMY      Family History  Problem Relation Age of Onset  . Hypertension Other     No Known Allergies  Current Outpatient Medications on File Prior to Visit  Medication Sig Dispense Refill  . cetirizine (ZYRTEC) 10 MG tablet Take 10 mg by mouth daily.      . fluticasone (FLONASE) 50 MCG/ACT nasal spray Place 2 sprays into both nostrils daily. 16 g 5  . nystatin (NYSTATIN) powder Apply topically 2 (two) times daily. 45 g 3  . omeprazole (PRILOSEC) 20 MG capsule Take 1 capsule (20 mg total) by mouth daily. 30 capsule 2  . triamcinolone (KENALOG) 0.025 % ointment Apply 1 application topically  2 (two) times daily. 30 g 0   No current facility-administered medications on file prior to visit.    BP 118/70   Temp 98 F (36.7 C)   Wt 215 lb (97.5 kg)   BMI 27.60 kg/m       Objective:   Physical Exam Vitals and nursing note reviewed.  Constitutional:      Appearance: Normal appearance.  HENT:     Right Ear: Ear canal and external ear normal. There is no impacted cerumen. Tympanic membrane is scarred. Tympanic membrane is not injected, erythematous or bulging.     Left Ear: There is impacted cerumen. Tympanic membrane is scarred. Tympanic membrane is not injected, erythematous or bulging.     Nose: Nose normal. No congestion or rhinorrhea.     Mouth/Throat:     Mouth: Mucous membranes are moist.     Pharynx: Oropharynx is clear.  Cardiovascular:     Rate and Rhythm: Normal rate and regular rhythm.     Pulses: Normal  pulses.     Heart sounds: Normal heart sounds.  Skin:    Capillary Refill: Capillary refill takes less than 2 seconds.  Neurological:     General: No focal deficit present.     Mental Status: He is alert and oriented to person, place, and time.  Psychiatric:        Mood and Affect: Mood normal.        Behavior: Behavior normal.        Thought Content: Thought content normal.        Judgment: Judgment normal.        Assessment & Plan:  1. Dysfunction of both eustachian tubes  - Ambulatory referral to ENT  2. Impacted cerumen of left ear Warm water was applied and gentle ear lavage performed to the left ear.  There were no complications and following the disimpaction the tympanic membrane was visibleTympanic membranes are intact following the procedure.  Auditory canals are normal.  The patient reported relief of symptoms after removal of cerumen. Patient tolerated procedure well.   Dorothyann Peng, NP

## 2019-11-23 ENCOUNTER — Ambulatory Visit (INDEPENDENT_AMBULATORY_CARE_PROVIDER_SITE_OTHER): Payer: BC Managed Care – PPO | Admitting: Otolaryngology

## 2019-11-23 ENCOUNTER — Encounter (INDEPENDENT_AMBULATORY_CARE_PROVIDER_SITE_OTHER): Payer: Self-pay | Admitting: Otolaryngology

## 2019-11-23 ENCOUNTER — Other Ambulatory Visit: Payer: Self-pay

## 2019-11-23 VITALS — Temp 97.9°F

## 2019-11-23 DIAGNOSIS — H9312 Tinnitus, left ear: Secondary | ICD-10-CM | POA: Diagnosis not present

## 2019-11-23 DIAGNOSIS — H9042 Sensorineural hearing loss, unilateral, left ear, with unrestricted hearing on the contralateral side: Secondary | ICD-10-CM | POA: Diagnosis not present

## 2019-11-23 DIAGNOSIS — J310 Chronic rhinitis: Secondary | ICD-10-CM

## 2019-11-23 DIAGNOSIS — H9313 Tinnitus, bilateral: Secondary | ICD-10-CM | POA: Diagnosis not present

## 2019-11-23 NOTE — Progress Notes (Signed)
HPI: Kevin Mcgee is a 49 y.o. male who presents is referred by his PCP for evaluation of ear complaints.  He has been having some noise in the left ear now for about 4 to 6 weeks.  He has had his ears cleaned and still has sensation of noise in the left ear that comes and goes.  He does have history of allergies and has used Flonase for a number of years.  He is referred by his PCP for evaluation of ears and hearing evaluation. Patient denies any loud noise exposure to his ears.  No history of trauma to the ear..  Past Medical History:  Diagnosis Date  . Allergy   . Asthma   . Sarcoidosis    Past Surgical History:  Procedure Laterality Date  . VASECTOMY     Social History   Socioeconomic History  . Marital status: Single    Spouse name: Not on file  . Number of children: Not on file  . Years of education: Not on file  . Highest education level: Not on file  Occupational History  . Not on file  Tobacco Use  . Smoking status: Former Smoker    Packs/day: 0.25    Years: 18.00    Pack years: 4.50    Types: Cigarettes    Start date: 79    Quit date: 2008    Years since quitting: 13.2  . Smokeless tobacco: Never Used  Substance and Sexual Activity  . Alcohol use: Yes    Alcohol/week: 0.0 standard drinks  . Drug use: No  . Sexual activity: Not on file  Other Topics Concern  . Not on file  Social History Narrative  . Not on file   Social Determinants of Health   Financial Resource Strain:   . Difficulty of Paying Living Expenses:   Food Insecurity:   . Worried About Charity fundraiser in the Last Year:   . Arboriculturist in the Last Year:   Transportation Needs:   . Film/video editor (Medical):   Marland Kitchen Lack of Transportation (Non-Medical):   Physical Activity:   . Days of Exercise per Week:   . Minutes of Exercise per Session:   Stress:   . Feeling of Stress :   Social Connections:   . Frequency of Communication with Friends and Family:   . Frequency of  Social Gatherings with Friends and Family:   . Attends Religious Services:   . Active Member of Clubs or Organizations:   . Attends Archivist Meetings:   Marland Kitchen Marital Status:    Family History  Problem Relation Age of Onset  . Hypertension Other    No Known Allergies Prior to Admission medications   Medication Sig Start Date End Date Taking? Authorizing Provider  cetirizine (ZYRTEC) 10 MG tablet Take 10 mg by mouth daily.     Yes [provider]  fluticasone (FLONASE) 50 MCG/ACT nasal spray Place 2 sprays into both nostrils daily. 01/06/18  Yes Nafziger, Tommi Rumps, NP  nystatin (NYSTATIN) powder Apply topically 2 (two) times daily. 08/28/18  Yes Nafziger, Tommi Rumps, NP  omeprazole (PRILOSEC) 20 MG capsule Take 1 capsule (20 mg total) by mouth daily. 01/06/18  Yes Nafziger, Tommi Rumps, NP  triamcinolone (KENALOG) 0.025 % ointment Apply 1 application topically 2 (two) times daily. 08/28/18  Yes Nafziger, Tommi Rumps, NP     Positive ROS: Otherwise negative  All other systems have been reviewed and were otherwise negative with the exception of  those mentioned in the HPI and as above.  Physical Exam: Constitutional: Alert, well-appearing, no acute distress Ears: External ears without lesions or tenderness.  He had minimal wax buildup in his ears that was cleaned in the office.  TMs otherwise appear clear although the right TM is much clearer than the left TM.  Both TMs have good mobility on pneumatic otoscopy.  No obvious middle ear fluid noted. Nasal: External nose without lesions. Septum slightly deviated to the left with moderate rhinitis..  After decongesting the nose both middle meatus regions were clear with no signs of infection. Oral: Lips and gums without lesions. Tongue and palate mucosa without lesions. Posterior oropharynx clear. Neck: No palpable adenopathy or masses Respiratory: Breathing comfortably  Skin: No facial/neck lesions or rash noted.  Hearing test was performed in the  office today and on hearing test he had essentially normal hearing in both ears although he had slight upper frequency sensorineural hearing loss in both ears slightly worse on the left compared to the right above 4000 frequency.  SRT's were 10 dB bilaterally.  He had type A tympanograms bilaterally.  Procedures  Assessment: New onset left ear tinnitus with essentially normal hearing evaluation otherwise. Chronic rhinitis  Plan: Recommended regular use of the Flonase 2 sprays each nostril at night. Concerning treatment of the tinnitus recommended using masking noise when the tinnitus is bothersome. There is not great treatment for tinnitus otherwise.  Discussed with him concerning using ear protection when around loud noise as this will exacerbate tinnitus. He will follow-up for recheck if the tinnitus worsens.   Narda Bonds, MD   CC:

## 2019-11-24 ENCOUNTER — Encounter (INDEPENDENT_AMBULATORY_CARE_PROVIDER_SITE_OTHER): Payer: Self-pay

## 2019-12-01 ENCOUNTER — Encounter: Payer: Self-pay | Admitting: Adult Health

## 2019-12-01 ENCOUNTER — Other Ambulatory Visit: Payer: Self-pay | Admitting: Adult Health

## 2019-12-01 DIAGNOSIS — B356 Tinea cruris: Secondary | ICD-10-CM

## 2019-12-01 MED ORDER — NYSTATIN 100000 UNIT/GM EX POWD: Freq: Two times a day (BID) | CUTANEOUS | 3 refills | Status: DC

## 2020-01-06 ENCOUNTER — Telehealth (INDEPENDENT_AMBULATORY_CARE_PROVIDER_SITE_OTHER): Payer: BC Managed Care – PPO | Admitting: Adult Health

## 2020-01-06 ENCOUNTER — Encounter: Payer: Self-pay | Admitting: Adult Health

## 2020-01-06 VITALS — Wt 206.0 lb

## 2020-01-06 DIAGNOSIS — H9202 Otalgia, left ear: Secondary | ICD-10-CM | POA: Diagnosis not present

## 2020-01-06 NOTE — Progress Notes (Signed)
Virtual Visit via Video Note  I connected with Kevin Mcgee on 01/06/20 at  4:00 PM EDT by a video enabled telemedicine application and verified that I am speaking with the correct person using two identifiers.  Location patient: home Location provider:work or home office Persons participating in the virtual visit: patient, provider  I discussed the limitations of evaluation and management by telemedicine and the availability of in person appointments. The patient expressed understanding and agreed to proceed.   HPI: 49 year old male who is being seen virtually for an acute issue of left-sided discomfort.  He reports that he woke up this morning with the mildly sharp pain feeling as though it was coming from the inside as well as the feeling of the inside of his ear swelling shut.  The day got has progressed his symptoms have improved.  He denies drainage from his left ear or loss of hearing past baseline.  He was recently seen by ear nose and throat which she had a normal exam his hearing test was essentially normal in both ears although he had slightly upper frequency sensorineural hearing loss in both ears slightly worse on the left compared to the right above the 4000 frequency.   ROS: See pertinent positives and negatives per HPI.  Past Medical History:  Diagnosis Date  . Allergy   . Asthma   . Sarcoidosis     Past Surgical History:  Procedure Laterality Date  . VASECTOMY      Family History  Problem Relation Age of Onset  . Hypertension Other        Current Outpatient Medications:  .  cetirizine (ZYRTEC) 10 MG tablet, Take 10 mg by mouth daily.  , Disp: , Rfl:  .  fexofenadine (ALLEGRA) 180 MG tablet, Take 180 mg by mouth daily., Disp: , Rfl:  .  fluticasone (FLONASE) 50 MCG/ACT nasal spray, Place 2 sprays into both nostrils daily., Disp: 16 g, Rfl: 5 .  nystatin (NYSTATIN) powder, Apply topically 2 (two) times daily., Disp: 45 g, Rfl: 3 .  omeprazole (PRILOSEC) 20  MG capsule, Take 1 capsule (20 mg total) by mouth daily., Disp: 30 capsule, Rfl: 2 .  triamcinolone (KENALOG) 0.025 % ointment, Apply 1 application topically 2 (two) times daily. (Patient not taking: Reported on 01/06/2020), Disp: 30 g, Rfl: 0  EXAM:  VITALS per patient if applicable:  GENERAL: alert, oriented, appears well and in no acute distress  HEENT: atraumatic, conjunttiva clear, no obvious abnormalities on inspection of external nose and ears  NECK: normal movements of the head and neck  LUNGS: on inspection no signs of respiratory distress, breathing rate appears normal, no obvious gross SOB, gasping or wheezing  CV: no obvious cyanosis  MS: moves all visible extremities without noticeable abnormality  PSYCH/NEURO: pleasant and cooperative, no obvious depression or anxiety, speech and thought processing grossly intact  ASSESSMENT AND PLAN:  Discussed the following assessment and plan:  1. Left ear pain -Pain has improved throughout the day which makes me believe this is not otitis media or otitis externa.  Possibly middle ear effusion from allergies.  He was advised to use Afrin over the weekend twice daily and follow-up next week if no improvement  Shirline Frees, NP     I discussed the assessment and treatment plan with the patient. The patient was provided an opportunity to ask questions and all were answered. The patient agreed with the plan and demonstrated an understanding of the instructions.   The patient was advised  to call back or seek an in-person evaluation if the symptoms worsen or if the condition fails to improve as anticipated.   Dorothyann Peng, NP

## 2020-02-18 ENCOUNTER — Encounter: Payer: Self-pay | Admitting: Adult Health

## 2020-02-18 ENCOUNTER — Other Ambulatory Visit: Payer: Self-pay | Admitting: Adult Health

## 2020-02-18 MED ORDER — FLUCONAZOLE 150 MG PO TABS: ORAL_TABLET | ORAL | 0 refills | Status: DC

## 2020-02-23 DIAGNOSIS — B353 Tinea pedis: Secondary | ICD-10-CM | POA: Diagnosis not present

## 2020-02-23 DIAGNOSIS — L304 Erythema intertrigo: Secondary | ICD-10-CM | POA: Diagnosis not present

## 2020-03-09 ENCOUNTER — Ambulatory Visit (INDEPENDENT_AMBULATORY_CARE_PROVIDER_SITE_OTHER): Payer: BC Managed Care – PPO | Admitting: Otolaryngology

## 2020-03-09 ENCOUNTER — Other Ambulatory Visit: Payer: Self-pay

## 2020-03-09 ENCOUNTER — Encounter (INDEPENDENT_AMBULATORY_CARE_PROVIDER_SITE_OTHER): Payer: Self-pay | Admitting: Otolaryngology

## 2020-03-09 VITALS — Temp 97.7°F

## 2020-03-09 DIAGNOSIS — H6982 Other specified disorders of Eustachian tube, left ear: Secondary | ICD-10-CM | POA: Diagnosis not present

## 2020-03-09 NOTE — Progress Notes (Signed)
HPI: Kevin Mcgee is a 49 y.o. male who returns today for evaluation of complaints of persistent congestion in the left ear following last visit 3 months ago.  He does have history of allergies and asthma.  He has been using Flonase regularly.  He feels pressure in the left ear and occasionally hears heartbeat in the left ear.  He does not really notice diminished hearing that much..  Past Medical History:  Diagnosis Date  . Allergy   . Asthma   . Sarcoidosis    Past Surgical History:  Procedure Laterality Date  . VASECTOMY     Social History   Socioeconomic History  . Marital status: Single    Spouse name: Not on file  . Number of children: Not on file  . Years of education: Not on file  . Highest education level: Not on file  Occupational History  . Not on file  Tobacco Use  . Smoking status: Former Smoker    Packs/day: 0.25    Years: 18.00    Pack years: 4.50    Types: Cigarettes    Start date: 42    Quit date: 2008    Years since quitting: 13.5  . Smokeless tobacco: Never Used  Substance and Sexual Activity  . Alcohol use: Yes    Alcohol/week: 0.0 standard drinks  . Drug use: No  . Sexual activity: Not on file  Other Topics Concern  . Not on file  Social History Narrative  . Not on file   Social Determinants of Health   Financial Resource Strain:   . Difficulty of Paying Living Expenses:   Food Insecurity:   . Worried About Programme researcher, broadcasting/film/video in the Last Year:   . Barista in the Last Year:   Transportation Needs:   . Freight forwarder (Medical):   Marland Kitchen Lack of Transportation (Non-Medical):   Physical Activity:   . Days of Exercise per Week:   . Minutes of Exercise per Session:   Stress:   . Feeling of Stress :   Social Connections:   . Frequency of Communication with Friends and Family:   . Frequency of Social Gatherings with Friends and Family:   . Attends Religious Services:   . Active Member of Clubs or Organizations:   . Attends  Banker Meetings:   Marland Kitchen Marital Status:    Family History  Problem Relation Age of Onset  . Hypertension Other    No Known Allergies Prior to Admission medications   Medication Sig Start Date End Date Taking? Authorizing Provider  cetirizine (ZYRTEC) 10 MG tablet Take 10 mg by mouth daily.     Yes [provider]  fexofenadine (ALLEGRA) 180 MG tablet Take 180 mg by mouth daily.   Yes [provider]  fluconazole (DIFLUCAN) 150 MG tablet Take one tab weekly x 3 weeks 02/18/20  Yes Nafziger, Kandee Keen, NP  fluticasone (FLONASE) 50 MCG/ACT nasal spray Place 2 sprays into both nostrils daily. 01/06/18  Yes Nafziger, Kandee Keen, NP  nystatin (NYSTATIN) powder Apply topically 2 (two) times daily. 12/01/19  Yes Nafziger, Kandee Keen, NP  omeprazole (PRILOSEC) 20 MG capsule Take 1 capsule (20 mg total) by mouth daily. 01/06/18  Yes Nafziger, Kandee Keen, NP  triamcinolone (KENALOG) 0.025 % ointment Apply 1 application topically 2 (two) times daily. 08/28/18  Yes Nafziger, Kandee Keen, NP     Positive ROS: Otherwise negative  All other systems have been reviewed and were otherwise negative with the exception  of those mentioned in the HPI and as above.  Physical Exam: Constitutional: Alert, well-appearing, no acute distress Ears: External ears without lesions or tenderness. Ear canals are clear bilaterally.  TMs are clear bilaterally with normal mobility on pneumatic otoscopy on both sides.  Auscultation of the left ear reveals no pulsatile tinnitus.  Hearing screening with the 1024 tuning fork revealed symmetric hearing.  AC > BC bilaterally with the 512 fork. Nasal: External nose without lesions.. Clear nasal passages Oral: Lips and gums without lesions. Tongue and palate mucosa without lesions. Posterior oropharynx clear. Neck: No palpable adenopathy or masses Respiratory: Breathing comfortably  Skin: No facial/neck lesions or rash noted.  Procedures  Assessment: Reviewed with patient that most  likely symptoms are related to eustachian tube dysfunction.  There is no signs of middle ear effusion or infection.  Plan: Recommended regular use of Nasacort and changed him from Flonase to Nasacort 2 sprays each nostril at night.  Also gave him a prescription for a short steroid taper Sterapred 10 mg 6-day Dosepak if symptoms do not improve on the Nasacort use. Reviewed the audiogram with him again today they demonstrated mild high-frequency hearing loss in both ears left slightly worse than right.   Narda Bonds, MD

## 2020-04-09 DIAGNOSIS — H1031 Unspecified acute conjunctivitis, right eye: Secondary | ICD-10-CM | POA: Diagnosis not present

## 2020-04-09 DIAGNOSIS — H00022 Hordeolum internum right lower eyelid: Secondary | ICD-10-CM | POA: Diagnosis not present

## 2020-04-09 DIAGNOSIS — B356 Tinea cruris: Secondary | ICD-10-CM | POA: Diagnosis not present

## 2020-04-13 DIAGNOSIS — L270 Generalized skin eruption due to drugs and medicaments taken internally: Secondary | ICD-10-CM | POA: Diagnosis not present

## 2020-04-13 DIAGNOSIS — L304 Erythema intertrigo: Secondary | ICD-10-CM | POA: Diagnosis not present

## 2020-04-13 DIAGNOSIS — B353 Tinea pedis: Secondary | ICD-10-CM | POA: Diagnosis not present

## 2020-05-18 DIAGNOSIS — L270 Generalized skin eruption due to drugs and medicaments taken internally: Secondary | ICD-10-CM | POA: Diagnosis not present

## 2020-05-18 DIAGNOSIS — L304 Erythema intertrigo: Secondary | ICD-10-CM | POA: Diagnosis not present

## 2020-07-04 ENCOUNTER — Encounter: Payer: Self-pay | Admitting: Adult Health

## 2020-07-04 ENCOUNTER — Telehealth (INDEPENDENT_AMBULATORY_CARE_PROVIDER_SITE_OTHER): Payer: BC Managed Care – PPO | Admitting: Adult Health

## 2020-07-04 VITALS — Temp 97.9°F | Wt 207.8 lb

## 2020-07-04 DIAGNOSIS — J309 Allergic rhinitis, unspecified: Secondary | ICD-10-CM | POA: Diagnosis not present

## 2020-07-04 DIAGNOSIS — J069 Acute upper respiratory infection, unspecified: Secondary | ICD-10-CM | POA: Diagnosis not present

## 2020-07-04 NOTE — Progress Notes (Signed)
Virtual Visit via Video Note  I connected with Shawnee Knapp  on 07/04/20 at  4:30 PM EDT by a video enabled telemedicine application and verified that I am speaking with the correct person using two identifiers.  Location patient: home Location provider:work or home office Persons participating in the virtual visit: patient, provider  I discussed the limitations of evaluation and management by telemedicine and the availability of in person appointments. The patient expressed understanding and agreed to proceed.   HPI: 49 year old male with significant seasonal allergies.  Being evaluated today for an acute on chronic issue.  His symptoms started roughly 7 days ago.  Symptoms include nasal congestion, productive cough, sore throat, mild headache, and some mild chest congestion.  He does feel as though his symptoms have started to improve.  He has taken over-the-counter decongestant and Mucinex which seemed to help with his symptoms.  Denies fevers, chills, sinus pain or pressure   ROS: See pertinent positives and negatives per HPI.  Past Medical History:  Diagnosis Date  . Allergy   . Asthma   . Sarcoidosis     Past Surgical History:  Procedure Laterality Date  . VASECTOMY      Family History  Problem Relation Age of Onset  . Hypertension Other        Current Outpatient Medications:  .  cetirizine (ZYRTEC) 10 MG tablet, Take 10 mg by mouth as needed. , Disp: , Rfl:  .  fexofenadine (ALLEGRA) 180 MG tablet, Take 180 mg by mouth daily., Disp: , Rfl:  .  fluticasone (FLONASE) 50 MCG/ACT nasal spray, Place 2 sprays into both nostrils daily., Disp: 16 g, Rfl: 5 .  omeprazole (PRILOSEC) 20 MG capsule, Take 1 capsule (20 mg total) by mouth daily. (Patient taking differently: Take 20 mg by mouth as needed. ), Disp: 30 capsule, Rfl: 2  EXAM:  VITALS per patient if applicable:  GENERAL: alert, oriented, appears well and in no acute distress  HEENT: atraumatic, conjunttiva  clear, no obvious abnormalities on inspection of external nose and ears  NECK: normal movements of the head and neck  LUNGS: on inspection no signs of respiratory distress, breathing rate appears normal, no obvious gross SOB, gasping or wheezing  CV: no obvious cyanosis  MS: moves all visible extremities without noticeable abnormality  PSYCH/NEURO: pleasant and cooperative, no obvious depression or anxiety, speech and thought processing grossly intact  ASSESSMENT AND PLAN:  Discussed the following assessment and plan:  1. Viral upper respiratory tract infection -Likely viral as it is improving.  Continue with symptomatic treatment.  If symptoms have not resolved then we will have him reach out via MyChart in the next 2 to 3 days.  2. Allergic rhinitis, unspecified seasonality, unspecified trigger  - Ambulatory referral to Allergy     I discussed the assessment and treatment plan with the patient. The patient was provided an opportunity to ask questions and all were answered. The patient agreed with the plan and demonstrated an understanding of the instructions.   The patient was advised to call back or seek an in-person evaluation if the symptoms worsen or if the condition fails to improve as anticipated.   Shirline Frees, NP

## 2020-07-09 ENCOUNTER — Encounter: Payer: Self-pay | Admitting: Adult Health

## 2020-08-01 DIAGNOSIS — H0288A Meibomian gland dysfunction right eye, upper and lower eyelids: Secondary | ICD-10-CM | POA: Diagnosis not present

## 2020-08-01 DIAGNOSIS — H0288B Meibomian gland dysfunction left eye, upper and lower eyelids: Secondary | ICD-10-CM | POA: Diagnosis not present

## 2020-09-12 ENCOUNTER — Other Ambulatory Visit: Payer: BC Managed Care – PPO

## 2020-09-21 ENCOUNTER — Encounter: Payer: Self-pay | Admitting: Adult Health

## 2020-09-22 ENCOUNTER — Other Ambulatory Visit: Payer: Self-pay | Admitting: Adult Health

## 2020-09-22 MED ORDER — TRIAMCINOLONE ACETONIDE 0.5 % EX OINT: 1.0000 "application " | TOPICAL_OINTMENT | Freq: Two times a day (BID) | CUTANEOUS | 2 refills | Status: DC

## 2020-12-29 ENCOUNTER — Encounter: Payer: Self-pay | Admitting: Adult Health

## 2020-12-31 MED ORDER — NYSTATIN 100000 UNIT/GM EX POWD: 1.0000 "application " | Freq: Three times a day (TID) | CUTANEOUS | 0 refills | Status: DC

## 2021-02-13 ENCOUNTER — Encounter: Payer: Self-pay | Admitting: Adult Health

## 2021-02-13 ENCOUNTER — Encounter: Payer: Self-pay | Admitting: Physician Assistant

## 2021-02-13 ENCOUNTER — Telehealth: Payer: 59 | Admitting: Physician Assistant

## 2021-02-13 DIAGNOSIS — J189 Pneumonia, unspecified organism: Secondary | ICD-10-CM | POA: Diagnosis not present

## 2021-02-13 MED ORDER — AZITHROMYCIN 250 MG PO TABS: ORAL_TABLET | ORAL | 0 refills | Status: DC

## 2021-02-13 MED ORDER — PREDNISONE 10 MG (21) PO TBPK: ORAL_TABLET | ORAL | 0 refills | Status: DC

## 2021-02-13 MED ORDER — BENZONATATE 100 MG PO CAPS: 100.0000 mg | ORAL_CAPSULE | Freq: Three times a day (TID) | ORAL | 0 refills | Status: DC | PRN

## 2021-02-13 NOTE — Patient Instructions (Signed)
Community-Acquired Pneumonia, Adult °Pneumonia is an infection of the lungs. It causes irritation and swelling in the airways of the lungs. Mucus and fluid may also build up inside the airways. This may cause coughing and trouble breathing. °One type of pneumonia can happen while you are in a hospital. A different type can happen when you are not in a hospital (community-acquired pneumonia). °What are the causes? °This condition is caused by germs (viruses, bacteria, or fungi). Some types of germs can spread from person to person. Pneumonia is not thought to spread from person to person. °What increases the risk? °You are more likely to develop this condition if: °You have a long-term (chronic) disease, such as: °Disease of the lungs. This may be chronic obstructive pulmonary disease (COPD) or asthma. °Heart failure. °Cystic fibrosis. °Diabetes. °Kidney disease. °Sickle cell disease. °HIV. °You have other health problems, such as: °Your body's defense system (immune system) is weak. °A condition that may cause you to breathe in fluids from your mouth and nose. °You had your spleen taken out. °You do not take good care of your teeth and mouth (poor dental hygiene). °You use or have used tobacco products. °You travel where the germs that cause this illness are common. °You are near certain animals or the places they live. °You are older than 50 years of age. °What are the signs or symptoms? °Symptoms of this condition include: °A cough. °A fever. °Sweating or chills. °Chest pain, often when you breathe deeply or cough. °Breathing problems, such as: °Fast breathing. °Trouble breathing. °Shortness of breath. °Feeling tired (fatigued). °Muscle aches. °How is this treated? °Treatment for this condition depends on many things, such as: °The cause of your illness. °Your medicines. °Your other health problems. °Most adults can be treated at home. Sometimes, treatment must happen in a hospital. °Treatment may include  medicines to kill germs. °Medicines may depend on which germ caused your illness. °Very bad pneumonia is rare. If you get it, you may: °Have a machine to help you breathe. °Have fluid taken away from around your lungs. °Follow these instructions at home: °Medicines °Take over-the-counter and prescription medicines only as told by your doctor. °Take cough medicine only if you are losing sleep. Cough medicine can keep your body from taking mucus away from your lungs. °If you were prescribed an antibiotic medicine, take it as told by your doctor. Do not stop taking the antibiotic even if you start to feel better. °Lifestyle °  °Do not drink alcohol. °Do not use any products that contain nicotine or tobacco, such as cigarettes, e-cigarettes, and chewing tobacco. If you need help quitting, ask your doctor. °Eat a healthy diet. This includes a lot of vegetables, fruits, whole grains, low-fat dairy products, and low-fat (lean) protein. °General instructions ° °Rest a lot. Sleep for at least 8 hours each night. °Sleep with your head and neck raised. Put a few pillows under your head or sleep in a reclining chair. °Return to your normal activities as told by your doctor. Ask your doctor what activities are safe for you. °Drink enough fluid to keep your pee (urine) pale yellow. °If your throat is sore, rinse your mouth often with salt water. To make salt water, dissolve ½-1 tsp (3-6 g) of salt in 1 cup (237 mL) of warm water. °Keep all follow-up visits as told by your doctor. This is important. °How is this prevented? °You can lower your risk of pneumonia by: °Getting the pneumonia shot (vaccine). These shots have different   types and schedules. Ask your doctor what works best for you. Think about getting this shot if: °You are older than 50 years of age. °You are 19-65 years of age and: °You are being treated for cancer. °You have long-term lung disease. °You have other problems that affect your body's defense system. Ask  your doctor if you have one of these. °Getting your flu shot every year. Ask your doctor which type of shot is best for you. °Going to the dentist as often as told. °Washing your hands often with soap and water for at least 20 seconds. If you cannot use soap and water, use hand sanitizer. °Contact a doctor if: °You have a fever. °You lose sleep because your cough medicine does not help. °Get help right away if: °You are short of breath and this gets worse. °You have more chest pain. °Your sickness gets worse. This is very serious if: °You are an older adult. °Your body's defense system is weak. °You cough up blood. °These symptoms may be an emergency. Do not wait to see if the symptoms will go away. Get medical help right away. Call your local emergency services (911 in the [place] Do not drive yourself to the hospital. °Summary °Pneumonia is an infection of the lungs. °Community-acquired pneumonia affects people who have not been in the hospital. Certain germs can cause this infection. °This condition may be treated with medicines that kill germs. °For very bad pneumonia, you may need a hospital stay and treatment to help with breathing. °This information is not intended to replace advice given to you by your health care provider. Make sure you discuss any questions you have with your health care provider. °Document Revised: 06/01/2019 Document Reviewed: 06/01/2019 °Elsevier Patient Education © 2022 [employer] ° °

## 2021-02-13 NOTE — Progress Notes (Signed)
Kevin Mcgee, Kevin Mcgee are scheduled for a virtual visit with your provider today.    Just as we do with appointments in the office, we must obtain your consent to participate.  Your consent will be active for this visit and any virtual visit you may have with one of our providers in the next 365 days.    If you have a MyChart account, I can also send a copy of this consent to you electronically.  All virtual visits are billed to your insurance company just like a traditional visit in the office.  As this is a virtual visit, video technology does not allow for your provider to perform a traditional examination.  This may limit your provider's ability to fully assess your condition.  If your provider identifies any concerns that need to be evaluated in person or the need to arrange testing such as labs, EKG, etc, we will make arrangements to do so.    Although advances in technology are sophisticated, we cannot ensure that it will always work on either your end or our end.  If the connection with a video visit is poor, we may have to switch to a telephone visit.  With either a video or telephone visit, we are not always able to ensure that we have a secure connection.   I need to obtain your verbal consent now.   Are you willing to proceed with your visit today?   Kevin Mcgee has provided verbal consent on 02/13/2021 for a virtual visit (video or telephone).   Kevin Loveless, PA-C 02/13/2021  2:29 PM    MyChart Video Visit    Virtual Visit via Video Note   This visit type was conducted due to national recommendations for restrictions regarding the COVID-19 Pandemic (e.g. social distancing) in an effort to limit this patient's exposure and mitigate transmission in our community. This patient is at least at moderate risk for complications without adequate follow up. This format is felt to be most appropriate for this patient at this time. Physical exam was limited by quality of the video and  audio technology used for the visit.   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 The patient expressed understanding and agreed to proceed.  Patient: Kevin Mcgee   DOB: [birth year]   50 y.o. Male  MRN: [id number] Visit Date: 02/13/2021  Today's healthcare provider: Margaretann Loveless, PA-C   No chief complaint on file.  Subjective    Cough This is a new problem. The current episode started in the past 7 days (started noticing Friday or Saturday). The problem has been unchanged. The problem occurs hourly. The cough is Non-productive. Associated symptoms include chest pain (when coughing), myalgias (one spot in left lower back sore today and was achy all over last couple days), rhinorrhea (seasonal allergies), a sore throat (in the beginning) and wheezing. Pertinent negatives include no fever, headaches, nasal congestion, postnasal drip or shortness of breath. The symptoms are aggravated by lying down. He has tried rest (Mucinex, sudafed, fexofenadine) for the symptoms. The treatment provided mild relief.   Was in hospital with father who had pneumonia  Patient Active Problem List   Diagnosis Date Noted   Contact dermatitis due to detergents 07/19/2014   Tendinitis of thumb 05/03/2014   Dysuria 05/19/2013   Acute prostatitis 05/19/2013   Contact dermatitis and eczema 10/21/2012  Dermatosis papulosa nigra 09/24/2012   Abnormal sensation of upper extremity 02/11/2012   CLUSTER HEADACHE 12/19/2009   OBSTRUCTIVE SLEEP APNEA 01/03/2009   ESOPHAGITIS, REFLUX 03/15/2008   ALLERGIC RHINITIS 02/01/2008   ASTHMA 02/01/2008   SARCOIDOSIS, PULMONARY 05/08/2007   Past Medical History:  Diagnosis Date   Allergy    Asthma    Sarcoidosis       Medications: Outpatient Medications Prior to Visit  Medication Sig   cetirizine  (ZYRTEC) 10 MG tablet Take 10 mg by mouth as needed.    fexofenadine (ALLEGRA) 180 MG tablet Take 180 mg by mouth daily.   fluticasone (FLONASE) 50 MCG/ACT nasal spray Place 2 sprays into both nostrils daily.   nystatin (MYCOSTATIN/NYSTOP) powder Apply 1 application topically 3 (three) times daily.   omeprazole (PRILOSEC) 20 MG capsule Take 1 capsule (20 mg total) by mouth daily. (Patient taking differently: Take 20 mg by mouth as needed. )   triamcinolone ointment (KENALOG) 0.5 % Apply 1 application topically 2 (two) times daily.   No facility-administered medications prior to visit.    Review of Systems  Constitutional:  Negative for fever.  HENT:  Positive for congestion (normal with allergies), rhinorrhea (seasonal allergies) and sore throat (in the beginning). Negative for postnasal drip.   Respiratory:  Positive for cough, chest tightness and wheezing. Negative for shortness of breath.   Cardiovascular:  Positive for chest pain (when coughing).  Musculoskeletal:  Positive for myalgias (one spot in left lower back sore today and was achy all over last couple days).  Neurological:  Negative for headaches.   Last CBC Lab Results  Component Value Date   WBC 4.3 01/06/2018   HGB 15.3 01/06/2018   HCT 45.5 01/06/2018   MCV 88.7 01/06/2018   RDW 13.7 01/06/2018   PLT 398.0 01/06/2018   Last metabolic panel Lab Results  Component Value Date   GLUCOSE 92 01/06/2018   NA 139 01/06/2018   K 4.5 01/06/2018   CL 102 01/06/2018   CO2 29 01/06/2018   BUN 15 01/06/2018   CREATININE 1.12 01/06/2018   CALCIUM 9.4 01/06/2018   PROT 7.1 01/06/2018   ALBUMIN 4.5 01/06/2018   BILITOT 0.4 01/06/2018   ALKPHOS 61 01/06/2018   AST 25 01/06/2018   ALT 37 01/06/2018      Objective    There were no vitals taken for this visit. BP Readings from Last 3 Encounters:  11/09/19 118/70  05/25/19 118/60  08/28/18 128/70   Wt Readings from Last 3 Encounters:  07/04/20 207 lb 12.8 oz (94.3  kg)  01/06/20 206 lb (93.4 kg)  11/09/19 215 lb (97.5 kg)      Physical Exam Vitals reviewed.  Constitutional:      General: He is not in acute distress.    Appearance: Normal appearance. He is well-developed. He is not ill-appearing.  HENT:     Head: Normocephalic and atraumatic.  Eyes:     Conjunctiva/sclera: Conjunctivae normal.  Pulmonary:     Effort: Pulmonary effort is normal. No respiratory distress (dry, throat clearing type cough heard x 3; no issues speaking in full, coherent sentences).  Musculoskeletal:     Cervical back: Normal range of motion and neck supple.     Comments: Patient reports left sided back pain this morning and points to area around left lower lung  Neurological:     Mental Status: He is alert.  Psychiatric:        Mood and Affect: Mood normal.  Behavior: Behavior normal.        Thought Content: Thought content normal.        Judgment: Judgment normal.       Assessment & Plan     1. Atypical pneumonia - Suspect atypical pneumonia, most likely left sided - Will treat with zpak, prednisone and tessalon perles.  - Patient already has a ProAir inhaler; advised he can use 1-2 puffs every 4-6 hours as needed for shortness of breath or wheezing - Push fluids.  - Rest.  - Seek in-person evaluation if worsening or not improving with treatment  - azithromycin (ZITHROMAX) 250 MG tablet; Take 2 tablets PO on day one, and one tablet PO daily thereafter until completed.  Dispense: 6 tablet; Refill: 0 - benzonatate (TESSALON) 100 MG capsule; Take 1 capsule (100 mg total) by mouth 3 (three) times daily as needed.  Dispense: 30 capsule; Refill: 0 - predniSONE (STERAPRED UNI-PAK 21 TAB) 10 MG (21) TBPK tablet; 6 day taper; take as directed on package instructions  Dispense: 21 tablet; Refill: 0   No follow-ups on file.     I discussed the assessment and treatment plan with the patient. The patient was provided an opportunity to ask questions and all  were answered. The patient agreed with the plan and demonstrated an understanding of the instructions.   The patient was advised to call back or seek an in-person evaluation if the symptoms worsen or if the condition fails to improve as anticipated.  I provided 14 minutes of face-to-face time during this encounter via MyChart Video enabled encounter.   [facility] [phone] (phone) [phone] (fax)  [facility]

## 2021-02-19 ENCOUNTER — Encounter: Payer: Self-pay | Admitting: Adult Health

## 2021-03-27 ENCOUNTER — Encounter: Payer: Self-pay | Admitting: Adult Health

## 2021-04-10 ENCOUNTER — Ambulatory Visit: Payer: 59 | Admitting: Adult Health

## 2021-04-10 ENCOUNTER — Encounter: Payer: Self-pay | Admitting: Adult Health

## 2021-04-10 ENCOUNTER — Other Ambulatory Visit: Payer: Self-pay

## 2021-04-10 VITALS — BP 110/68 | HR 93 | Temp 99.0°F | Ht 73.0 in | Wt 194.0 lb

## 2021-04-10 DIAGNOSIS — Z125 Encounter for screening for malignant neoplasm of prostate: Secondary | ICD-10-CM | POA: Diagnosis not present

## 2021-04-10 DIAGNOSIS — Z1159 Encounter for screening for other viral diseases: Secondary | ICD-10-CM

## 2021-04-10 DIAGNOSIS — Z0000 Encounter for general adult medical examination without abnormal findings: Secondary | ICD-10-CM

## 2021-04-10 DIAGNOSIS — D869 Sarcoidosis, unspecified: Secondary | ICD-10-CM | POA: Diagnosis not present

## 2021-04-10 DIAGNOSIS — K219 Gastro-esophageal reflux disease without esophagitis: Secondary | ICD-10-CM | POA: Diagnosis not present

## 2021-04-10 DIAGNOSIS — Z1211 Encounter for screening for malignant neoplasm of colon: Secondary | ICD-10-CM

## 2021-04-10 DIAGNOSIS — R0789 Other chest pain: Secondary | ICD-10-CM | POA: Diagnosis not present

## 2021-04-10 DIAGNOSIS — R5383 Other fatigue: Secondary | ICD-10-CM

## 2021-04-10 NOTE — Patient Instructions (Addendum)
It was great seeing you today   We will follow up with you regarding your xray and labs   EKG was normal   Continue to work on diet and exercise

## 2021-04-10 NOTE — Progress Notes (Signed)
Subjective:    Patient ID: Kevin Mcgee, male    DOB: 06/08/1971, 50 y.o.   MRN: [id number]  HPI Patient presents for yearly preventative medicine examination. He is a pleasant 50 year old male who  has a past medical history of Allergy, Asthma, and Sarcoidosis.  His last CPE was in 2019   GERD - controlled with Prilosec   Seasonal Allergies - Controlled with Zyrtec and Flonase  H/o Sarcoidosis -denies signs or symptoms.  Last chest x-ray was in 2019 showed a stable appearing changes consistent with sarcoidosis.  Fatigue -reports ongoing issue for quite some time, possibly years.  Reports that he is sleeping fairly well using CBD and is able to sleep through the night but does not wake up feeling well rested.  Does not wake up gasping for breath, no longer snores. Is wondering if his Testosterone is low   Chest Pain -new issue, has been present over the last few days.  Chest pain is on the left side without radiation.  Is intermittent and described as mild discomfort.  Happens at rest and with exertion.  Denies shortness of breath, dizziness, or lightheadedness.  All immunizations and health maintenance protocols were reviewed with the patient and needed orders were placed.  Appropriate screening laboratory values were ordered for the patient including screening of hyperlipidemia, renal function and hepatic function. If indicated by BPH, a PSA was ordered.  Medication reconciliation,  past medical history, social history, problem list and allergies were reviewed in detail with the patient  Goals were established with regard to weight loss, exercise, and  diet in compliance with medications.  He has been eating healthier and exercising more frequently.  He has been able to lose weight and feels better with the weight loss.  Wt Readings from Last 3 Encounters:  04/10/21 194 lb (88 kg)  07/04/20 207 lb 12.8 oz (94.3 kg)  01/06/20 206 lb (93.4 kg)    He is due for routine colon  cancer screening  Review of Systems  Constitutional:  Positive for fatigue.  HENT: Negative.    Eyes: Negative.   Respiratory: Negative.    Cardiovascular:  Positive for chest pain. Negative for palpitations and leg swelling.  Gastrointestinal: Negative.   Endocrine: Negative.   Genitourinary: Negative.   Musculoskeletal: Negative.   Skin: Negative.   Allergic/Immunologic: Negative.   Neurological: Negative.   Hematological: Negative.   Psychiatric/Behavioral: Negative.    All other systems reviewed and are negative.  Past Medical History:  Diagnosis Date   Allergy    Asthma    Sarcoidosis     Social History   Socioeconomic History   Marital status: Single    Spouse name: Not on file   Number of children: Not on file   Years of education: Not on file   Highest education level: Not on file  Occupational History   Not on file  Tobacco Use   Smoking status: Former    Packs/day: 0.25    Years: 18.00    Pack years: 4.50    Types: Cigarettes    Start date: 47    Quit date: 2008    Years since quitting: 14.6   Smokeless tobacco: Never  Substance and Sexual Activity   Alcohol use: Yes    Alcohol/week: 0.0 standard drinks   Drug use: No   Sexual activity: Not on file  Other Topics Concern   Not on file  Social History Narrative   Not on file  Social Determinants of Health   Financial Resource Strain: Not on file  Food Insecurity: Not on file  Transportation Needs: Not on file  Physical Activity: Not on file  Stress: Not on file  Social Connections: Not on file  Intimate Partner Violence: Not on file    Past Surgical History:  Procedure Laterality Date   VASECTOMY      Family History  Problem Relation Age of Onset   Hypertension Other     No Known Allergies  Current Outpatient Medications on File Prior to Visit  Medication Sig Dispense Refill   azithromycin (ZITHROMAX) 250 MG tablet Take 2 tablets PO on day one, and one tablet PO daily  thereafter until completed. 6 tablet 0   benzonatate (TESSALON) 100 MG capsule Take 1 capsule (100 mg total) by mouth 3 (three) times daily as needed. 30 capsule 0   cetirizine (ZYRTEC) 10 MG tablet Take 10 mg by mouth as needed.      fexofenadine (ALLEGRA) 180 MG tablet Take 180 mg by mouth daily.     fluticasone (FLONASE) 50 MCG/ACT nasal spray Place 2 sprays into both nostrils daily. 16 g 5   nystatin (MYCOSTATIN/NYSTOP) powder Apply 1 application topically 3 (three) times daily. 60 g 0   omeprazole (PRILOSEC) 20 MG capsule Take 1 capsule (20 mg total) by mouth daily. (Patient taking differently: Take 20 mg by mouth as needed. ) 30 capsule 2   predniSONE (STERAPRED UNI-PAK 21 TAB) 10 MG (21) TBPK tablet 6 day taper; take as directed on package instructions 21 tablet 0   triamcinolone ointment (KENALOG) 0.5 % Apply 1 application topically 2 (two) times daily. 30 g 2   No current facility-administered medications on file prior to visit.    Vitals:   04/10/21 1400  BP: 110/68  Pulse: 93  Temp: 99 F (37.2 C)  SpO2: 99%          Objective:   Physical Exam Vitals and nursing note reviewed.  Constitutional:      General: He is not in acute distress.    Appearance: Normal appearance. He is well-developed and normal weight.  HENT:     Head: Normocephalic and atraumatic.     Right Ear: Tympanic membrane, ear canal and external ear normal. There is no impacted cerumen.     Left Ear: Tympanic membrane, ear canal and external ear normal. There is no impacted cerumen.     Nose: Nose normal. No congestion or rhinorrhea.     Mouth/Throat:     Mouth: Mucous membranes are moist.     Pharynx: Oropharynx is clear. No oropharyngeal exudate or posterior oropharyngeal erythema.  Eyes:     General:        Right eye: No discharge.        Left eye: No discharge.     Extraocular Movements: Extraocular movements intact.     Conjunctiva/sclera: Conjunctivae normal.     Pupils: Pupils are equal,  round, and reactive to light.  Neck:     Vascular: No carotid bruit.     Trachea: No tracheal deviation.  Cardiovascular:     Rate and Rhythm: Normal rate and regular rhythm.     Pulses: Normal pulses.     Heart sounds: Normal heart sounds. No murmur heard.   No friction rub. No gallop.  Pulmonary:     Effort: Pulmonary effort is normal. No respiratory distress.     Breath sounds: Normal breath sounds. No stridor. No wheezing, rhonchi or  rales.  Chest:     Chest wall: No tenderness.  Abdominal:     General: Bowel sounds are normal. There is no distension.     Palpations: Abdomen is soft. There is no mass.     Tenderness: There is no abdominal tenderness. There is no right CVA tenderness, left CVA tenderness, guarding or rebound.     Hernia: No hernia is present.  Musculoskeletal:        General: No swelling, tenderness, deformity or signs of injury. Normal range of motion.     Right lower leg: No edema.     Left lower leg: No edema.  Lymphadenopathy:     Cervical: No cervical adenopathy.  Skin:    General: Skin is warm and dry.     Capillary Refill: Capillary refill takes less than 2 seconds.     Coloration: Skin is not jaundiced or pale.     Findings: No bruising, erythema, lesion or rash.  Neurological:     General: No focal deficit present.     Mental Status: He is alert and oriented to person, place, and time.     Cranial Nerves: No cranial nerve deficit.     Sensory: No sensory deficit.     Motor: No weakness.     Coordination: Coordination normal.     Gait: Gait normal.     Deep Tendon Reflexes: Reflexes normal.  Psychiatric:        Mood and Affect: Mood normal.        Behavior: Behavior normal.        Thought Content: Thought content normal.        Judgment: Judgment normal.      Assessment & Plan:  1. Routine general medical examination at a health care facility - Continue with lifestyle modifications  - Follow up in one year or sooner if needed - CBC with  Differential/Platelet; Future - Comprehensive metabolic panel; Future - Hemoglobin A1c; Future - Lipid panel; Future - TSH; Future  2. SARCOIDOSIS, PULMONARY  - DG Chest 2 View; Future  3. Prostate cancer screening  - PSA; Future  4. Gastroesophageal reflux disease without esophagitis - Continue with Prilosec   5. Atypical chest pain -EKG showed normal sinus rhythm with a rate of 93.  Cannot rule out cardiac cause.  Will check labs consider referral to cardiology - CBC with Differential/Platelet; Future - Comprehensive metabolic panel; Future - Hemoglobin A1c; Future - Lipid panel; Future - TSH; Future - DG Chest 2 View; Future - EKG 12-Lead  6. Other fatigue -Unknown cause.  Cannot rule out sleep apnea.  Will check labs.  Consider referral to pulmonary - CBC with Differential/Platelet; Future - Comprehensive metabolic panel; Future - Hemoglobin A1c; Future - Lipid panel; Future - TSH; Future - EKG 12-Lead - Testosterone Total,Free,Bio, Males; Future  7. Colon cancer screening  - Cologuard  8. Need for hepatitis C screening test  - Hep C Antibody; Future  Shirline Frees, NP

## 2021-04-11 ENCOUNTER — Other Ambulatory Visit (INDEPENDENT_AMBULATORY_CARE_PROVIDER_SITE_OTHER): Payer: 59

## 2021-04-11 ENCOUNTER — Ambulatory Visit (INDEPENDENT_AMBULATORY_CARE_PROVIDER_SITE_OTHER)
Admission: RE | Admit: 2021-04-11 | Discharge: 2021-04-11 | Disposition: A | Discharge status: Home / Self Care | Payer: 59 | Source: Ambulatory Visit | Attending: Adult Health | Admitting: Adult Health

## 2021-04-11 DIAGNOSIS — R0789 Other chest pain: Secondary | ICD-10-CM

## 2021-04-11 DIAGNOSIS — Z125 Encounter for screening for malignant neoplasm of prostate: Secondary | ICD-10-CM | POA: Diagnosis not present

## 2021-04-11 DIAGNOSIS — R5383 Other fatigue: Secondary | ICD-10-CM

## 2021-04-11 DIAGNOSIS — Z0000 Encounter for general adult medical examination without abnormal findings: Secondary | ICD-10-CM | POA: Diagnosis not present

## 2021-04-11 DIAGNOSIS — D869 Sarcoidosis, unspecified: Secondary | ICD-10-CM

## 2021-04-11 DIAGNOSIS — Z1159 Encounter for screening for other viral diseases: Secondary | ICD-10-CM

## 2021-04-11 LAB — COMPREHENSIVE METABOLIC PANEL
ALT: 23 U/L (ref 0–53)
AST: 20 U/L (ref 0–37)
Albumin: 4.3 g/dL (ref 3.5–5.2)
Alkaline Phosphatase: 58 U/L (ref 39–117)
BUN: 14 mg/dL (ref 6–23)
CO2: 24 mEq/L (ref 19–32)
Calcium: 9.3 mg/dL (ref 8.4–10.5)
Chloride: 105 mEq/L (ref 96–112)
Creatinine, Ser: 1.15 mg/dL (ref 0.40–1.50)
GFR: 74.23 mL/min (ref >60.00)
Glucose, Bld: 119 mg/dL — ABNORMAL HIGH (ref 70–99)
Potassium: 4.6 mEq/L (ref 3.5–5.1)
Sodium: 141 mEq/L (ref 135–145)
Total Bilirubin: 0.4 mg/dL (ref 0.2–1.2)
Total Protein: 6.7 g/dL (ref 6.0–8.3)

## 2021-04-11 LAB — CBC WITH DIFFERENTIAL/PLATELET
Basophils Absolute: 0 10*3/uL (ref 0.0–0.1)
Basophils Relative: 0.9 % (ref 0.0–3.0)
Eosinophils Absolute: 0.2 10*3/uL (ref 0.0–0.7)
Eosinophils Relative: 4.6 % (ref 0.0–5.0)
HCT: 45.7 % (ref 39.0–52.0)
Hemoglobin: 15.1 g/dL (ref 13.0–17.0)
Lymphocytes Relative: 28.8 % (ref 12.0–46.0)
Lymphs Abs: 1.1 10*3/uL (ref 0.7–4.0)
MCHC: 33.2 g/dL (ref 30.0–36.0)
MCV: 89.1 fl (ref 78.0–100.0)
Monocytes Absolute: 0.4 10*3/uL (ref 0.1–1.0)
Monocytes Relative: 10.1 % (ref 3.0–12.0)
Neutro Abs: 2.2 10*3/uL (ref 1.4–7.7)
Neutrophils Relative %: 55.6 % (ref 43.0–77.0)
Platelets: 400 10*3/uL (ref 150.0–400.0)
RBC: 5.12 Mil/uL (ref 4.22–5.81)
RDW: 13.5 % (ref 11.5–15.5)
WBC: 4 10*3/uL (ref 4.0–10.5)

## 2021-04-11 LAB — LIPID PANEL
Cholesterol: 193 mg/dL (ref 0–200)
HDL: 44 mg/dL (ref >39.00)
LDL Cholesterol: 130 mg/dL — ABNORMAL HIGH (ref 0–99)
NonHDL: 148.98
Total CHOL/HDL Ratio: 4
Triglycerides: 96 mg/dL (ref 0.0–149.0)
VLDL: 19.2 mg/dL (ref 0.0–40.0)

## 2021-04-11 LAB — HEMOGLOBIN A1C: Hgb A1c MFr Bld: 6 % (ref 4.6–6.5)

## 2021-04-11 LAB — PSA: PSA: 0.33 ng/mL (ref 0.10–4.00)

## 2021-04-11 LAB — TSH: TSH: 1.33 u[IU]/mL (ref 0.35–5.50)

## 2021-04-12 ENCOUNTER — Telehealth: Payer: Self-pay | Admitting: Adult Health

## 2021-04-12 DIAGNOSIS — D869 Sarcoidosis, unspecified: Secondary | ICD-10-CM

## 2021-04-12 DIAGNOSIS — R7989 Other specified abnormal findings of blood chemistry: Secondary | ICD-10-CM

## 2021-04-12 LAB — TESTOSTERONE TOTAL,FREE,BIO, MALES
Albumin: 4.2 g/dL (ref 3.6–5.1)
Sex Hormone Binding: 17 nmol/L (ref 10–50)
Testosterone, Bioavailable: 158.9 ng/dL (ref 110.0–575.0)
Testosterone, Free: 82.5 pg/mL (ref 46.0–224.0)
Testosterone: 377 ng/dL (ref 250–827)

## 2021-04-12 LAB — HEPATITIS C ANTIBODY
Hepatitis C Ab: NONREACTIVE
SIGNAL TO CUT-OFF: 0.03 (ref <1.00)

## 2021-04-12 NOTE — Telephone Encounter (Signed)
Spoke to patient and informed of xray sresults and labs   His Testosterone is on the low side - will send to Urology for management   Chest x-ray showed progression of sarcoidosis.  It is been a long time since he has seen pulmonary.  We will send him back to pulmonary for further evaluation

## 2021-04-27 LAB — COLOGUARD: Cologuard: NEGATIVE

## 2021-06-03 ENCOUNTER — Encounter: Payer: Self-pay | Admitting: Adult Health

## 2021-08-09 ENCOUNTER — Ambulatory Visit: Payer: 59 | Admitting: Adult Health

## 2021-08-09 ENCOUNTER — Encounter: Payer: Self-pay | Admitting: Adult Health

## 2021-08-09 VITALS — BP 120/70 | Temp 98.5°F | Ht 73.0 in | Wt 195.0 lb

## 2021-08-09 DIAGNOSIS — R030 Elevated blood-pressure reading, without diagnosis of hypertension: Secondary | ICD-10-CM | POA: Diagnosis not present

## 2021-08-09 DIAGNOSIS — F4323 Adjustment disorder with mixed anxiety and depressed mood: Secondary | ICD-10-CM

## 2021-08-09 MED ORDER — BUPROPION HCL ER (XL) 150 MG PO TB24: 150.0000 mg | ORAL_TABLET | Freq: Every day | ORAL | 0 refills | Status: DC

## 2021-08-09 NOTE — Progress Notes (Signed)
Subjective:    Patient ID: Kevin Mcgee, male    DOB: Jan 21, 1971, 50 y.o.   MRN: [id number]  HPI  50 year old male who  has a past medical history of Allergy, Asthma, and Sarcoidosis.  He presents to the office today for concern of elevated blood pressure readings and increased stress.  Over the last few days he has been monitoring his blood pressure readings at home because he he has been feeling my blood moving through my body".  When he was checking at home his blood pressures were in the 140s systolic.  He noticed at times distressed that his blood pressure would be higher.  Does report increased stress with his marriage as he and his wife are not getting along.  They are sleeping in separate bedrooms and he feels as though this is a roommate rather than the wife.  He is having difficulty sleeping ( chronic issues but has become worse) feels as though he cannot turn his brain off which causes him to feel fatigued throughout the day.  He does have a counselor who he has been seeing for awhile and finds this helpful    Review of Systems See HPI   Past Medical History:  Diagnosis Date   Allergy    Asthma    Sarcoidosis     Social History   Socioeconomic History   Marital status: Single    Spouse name: Not on file   Number of children: Not on file   Years of education: Not on file   Highest education level: Master's degree (e.g., MA, MS, MEng, MEd, MSW, MBA)  Occupational History   Not on file  Tobacco Use   Smoking status: Former    Packs/day: 0.25    Years: 18.00    Pack years: 4.50    Types: Cigarettes    Start date: 69    Quit date: 2008    Years since quitting: 14.9   Smokeless tobacco: Never  Substance and Sexual Activity   Alcohol use: Yes    Alcohol/week: 0.0 standard drinks   Drug use: No   Sexual activity: Not on file  Other Topics Concern   Not on file  Social History Narrative   Not on file   Social Determinants of Health   Financial Resource  Strain: Low Risk    Difficulty of Paying Living Expenses: Not hard at all  Food Insecurity: No Food Insecurity   Worried About Programme researcher, broadcasting/film/video in the Last Year: Never true   Ran Out of Food in the Last Year: Never true  Transportation Needs: No Transportation Needs   Lack of Transportation (Medical): No   Lack of Transportation (Non-Medical): No  Physical Activity: Insufficiently Active   Days of Exercise per Week: 2 days   Minutes of Exercise per Session: 60 min  Stress: Stress Concern Present   Feeling of Stress : Very much  Social Connections: Moderately Isolated   Frequency of Communication with Friends and Family: Three times a week   Frequency of Social Gatherings with Friends and Family: Never   Attends Religious Services: Never   Database administrator or Organizations: No   Attends Engineer, structural: Not on file   Marital Status: Married  Catering manager Violence: Not on file    Past Surgical History:  Procedure Laterality Date   VASECTOMY      Family History  Problem Relation Age of Onset   Hypertension Other  No Known Allergies  Current Outpatient Medications on File Prior to Visit  Medication Sig Dispense Refill   cetirizine (ZYRTEC) 10 MG tablet Take 10 mg by mouth as needed.      fexofenadine (ALLEGRA) 180 MG tablet Take 180 mg by mouth daily. (Patient not taking: Reported on 04/10/2021)     fluticasone (FLONASE) 50 MCG/ACT nasal spray Place 2 sprays into both nostrils daily. 16 g 5   nystatin (MYCOSTATIN/NYSTOP) powder Apply 1 application topically 3 (three) times daily. 60 g 0   omeprazole (PRILOSEC) 20 MG capsule Take 1 capsule (20 mg total) by mouth daily. (Patient taking differently: Take 20 mg by mouth as needed.) 30 capsule 2   No current facility-administered medications on file prior to visit.    BP 120/70   Temp 98.5 F (36.9 C) (Oral)   Ht 6\' 1"  (1.854 m)   Wt 195 lb (88.5 kg)   BMI 25.73 kg/m        Objective:    Physical Exam Vitals and nursing note reviewed.  Constitutional:      Appearance: Normal appearance.  Cardiovascular:     Rate and Rhythm: Normal rate and regular rhythm.     Pulses: Normal pulses.     Heart sounds: Normal heart sounds.  Pulmonary:     Effort: Pulmonary effort is normal.     Breath sounds: Normal breath sounds.  Skin:    General: Skin is warm and dry.  Neurological:     General: No focal deficit present.     Mental Status: He is alert and oriented to person, place, and time.  Psychiatric:        Mood and Affect: Mood normal.        Behavior: Behavior normal.        Thought Content: Thought content normal.        Judgment: Judgment normal.      Assessment & Plan:  1. Adjustment reaction with anxiety and depression - PHQ9 score = 16 -Discussed various therapies with the patient.  He is interested in starting medication.  We will place him on Wellbutrin 150 mg extended release.  Was advised that it may take up to 4 weeks to start working.  We will have him follow-up in 1 month to see how he is doing. - buPROPion (WELLBUTRIN XL) 150 MG 24 hr tablet; Take 1 tablet (150 mg total) by mouth daily.  Dispense: 90 tablet; Refill: 0  2. Elevated blood pressure reading - normal in the office today. Likely stress response   , NP  Time spent on chart review, time with patient; discussion of stress, anxiety, depression, and elevated blood pressure readings,  home monitoring with cuff, depression treatment, follow up plan, and documentation 30 minutes

## 2021-08-15 ENCOUNTER — Institutional Professional Consult (permissible substitution): Payer: 59 | Admitting: Pulmonary Disease

## 2021-08-29 ENCOUNTER — Encounter: Payer: Self-pay | Admitting: Adult Health

## 2021-10-04 ENCOUNTER — Encounter: Payer: Self-pay | Admitting: Adult Health

## 2021-10-05 ENCOUNTER — Encounter: Payer: Self-pay | Admitting: Adult Health

## 2021-10-05 ENCOUNTER — Ambulatory Visit: Payer: 59 | Admitting: Adult Health

## 2021-10-05 VITALS — BP 110/80 | HR 90 | Temp 98.1°F | Ht 73.0 in | Wt 201.0 lb

## 2021-10-05 DIAGNOSIS — F4323 Adjustment disorder with mixed anxiety and depressed mood: Secondary | ICD-10-CM | POA: Diagnosis not present

## 2021-10-05 MED ORDER — CITALOPRAM HYDROBROMIDE 20 MG PO TABS: 20.0000 mg | ORAL_TABLET | Freq: Every day | ORAL | 0 refills | Status: DC

## 2021-10-05 NOTE — Progress Notes (Signed)
Subjective:    Patient ID: Kevin Mcgee, male    DOB: May 15, 1971, 51 y.o.   MRN: [id number]  HPI 51 year old male who  has a past medical history of Allergy, Asthma, and Sarcoidosis.  He presents to the office today for follow-up regarding anxiety and depression.  He was last seen in early December 2022 he reported increased stress with his marriage as him and his wife are not getting along.  They are sleeping in separate bedrooms and he felt as though he was "over roommate rather than husband".  He was having difficulty sleeping due to not being able to turn his brain off which caused him to feel fatigued all day.  His PHQ-9 score was 16  We started him on Wellbutrin 150 mg extended release.  He reports that he took this for about 6 weeks and did not notice any improvement so he stopped it.  Has not noticed any difference from stopping the medication.  Continues to have difficulty sleeping due to anxiety and is feeling stressed out still due to his marriage.  He is interested in trying something different.   Review of Systems See HPI   Past Medical History:  Diagnosis Date   Allergy    Asthma    Sarcoidosis     Social History   Socioeconomic History   Marital status: Single    Spouse name: Not on file   Number of children: Not on file   Years of education: Not on file   Highest education level: Master's degree (e.g., MA, MS, MEng, MEd, MSW, MBA)  Occupational History   Not on file  Tobacco Use   Smoking status: Former    Packs/day: 0.25    Years: 18.00    Pack years: 4.50    Types: Cigarettes    Start date: 40    Quit date: 2008    Years since quitting: 15.1   Smokeless tobacco: Never  Substance and Sexual Activity   Alcohol use: Yes    Alcohol/week: 0.0 standard drinks   Drug use: No   Sexual activity: Not on file  Other Topics Concern   Not on file  Social History Narrative   Not on file   Social Determinants of Health   Financial Resource Strain: Low  Risk    Difficulty of Paying Living Expenses: Not hard at all  Food Insecurity: No Food Insecurity   Worried About Programme researcher, broadcasting/film/video in the Last Year: Never true   Ran Out of Food in the Last Year: Never true  Transportation Needs: No Transportation Needs   Lack of Transportation (Medical): No   Lack of Transportation (Non-Medical): No  Physical Activity: Insufficiently Active   Days of Exercise per Week: 2 days   Minutes of Exercise per Session: 60 min  Stress: Stress Concern Present   Feeling of Stress : Very much  Social Connections: Moderately Isolated   Frequency of Communication with Friends and Family: Three times a week   Frequency of Social Gatherings with Friends and Family: Never   Attends Religious Services: Never   Database administrator or Organizations: No   Attends Engineer, structural: Not on file   Marital Status: Married  Catering manager Violence: Not on file    Past Surgical History:  Procedure Laterality Date   VASECTOMY      Family History  Problem Relation Age of Onset   Hypertension Other     No Known Allergies  Current Outpatient Medications on File Prior to Visit  Medication Sig Dispense Refill   cetirizine (ZYRTEC) 10 MG tablet Take 10 mg by mouth as needed.      fluticasone (FLONASE) 50 MCG/ACT nasal spray Place 2 sprays into both nostrils daily. 16 g 5   nystatin (MYCOSTATIN/NYSTOP) powder Apply 1 application topically 3 (three) times daily. 60 g 0   No current facility-administered medications on file prior to visit.    BP 110/80    Pulse 90    Temp 98.1 F (36.7 C) (Oral)    Ht 6\' 1"  (1.854 m)    Wt 201 lb (91.2 kg)    SpO2 98%    BMI 26.52 kg/m       Objective:   Physical Exam Vitals and nursing note reviewed.  Constitutional:      Appearance: Normal appearance.  Skin:    General: Skin is warm and dry.     Capillary Refill: Capillary refill takes less than 2 seconds.  Neurological:     General: No focal deficit  present.     Mental Status: He is alert and oriented to person, place, and time.  Psychiatric:        Mood and Affect: Mood normal.        Behavior: Behavior normal.        Thought Content: Thought content normal.        Judgment: Judgment normal.      Assessment & Plan:  1. Adjustment reaction with anxiety and depression - PHQ9 score today is 8.  -We will switch him to Celexa 20 mg daily.  Advised follow-up in 1 month. [place] Office Visit from 10/05/2021 in [place] at [facility]  PHQ-9 Total Score 8      - citalopram (CELEXA) 20 MG tablet; Take 1 tablet (20 mg total) by mouth daily.  Dispense: 90 tablet; Refill: 0  Dorothyann Peng, NP

## 2021-10-24 ENCOUNTER — Ambulatory Visit: Payer: 59 | Admitting: Adult Health

## 2021-10-30 ENCOUNTER — Encounter: Payer: Self-pay | Admitting: Adult Health

## 2021-10-30 NOTE — Telephone Encounter (Signed)
Pt has been scheduled.  °

## 2021-11-01 ENCOUNTER — Ambulatory Visit: Payer: 59 | Admitting: Adult Health

## 2021-12-27 ENCOUNTER — Encounter: Payer: Self-pay | Admitting: Adult Health

## 2021-12-27 ENCOUNTER — Ambulatory Visit (INDEPENDENT_AMBULATORY_CARE_PROVIDER_SITE_OTHER): Payer: 59 | Admitting: Adult Health

## 2021-12-27 VITALS — BP 138/80 | HR 93 | Temp 98.4°F | Ht 73.0 in | Wt 202.0 lb

## 2021-12-27 DIAGNOSIS — G479 Sleep disorder, unspecified: Secondary | ICD-10-CM

## 2021-12-27 DIAGNOSIS — F4323 Adjustment disorder with mixed anxiety and depressed mood: Secondary | ICD-10-CM

## 2021-12-27 DIAGNOSIS — J988 Other specified respiratory disorders: Secondary | ICD-10-CM | POA: Diagnosis not present

## 2021-12-27 MED ORDER — TRAZODONE HCL 100 MG PO TABS: 100.0000 mg | ORAL_TABLET | Freq: Every day | ORAL | 0 refills | Status: DC

## 2021-12-27 MED ORDER — AZITHROMYCIN 250 MG PO TABS: ORAL_TABLET | ORAL | 0 refills | Status: AC

## 2021-12-27 MED ORDER — PREDNISONE 10 MG PO TABS: ORAL_TABLET | ORAL | 0 refills | Status: DC

## 2021-12-27 NOTE — Progress Notes (Signed)
? ?Subjective:  ? ? Patient ID: Kevin Mcgee, male    DOB: [birth year], 51 y.o.   MRN: [id number] ? ?HPI ? ?51 year old male who  has a past medical history of Allergy, Asthma, and Sarcoidosis. ? ?He presents to the office today for an acute issue with a complaint of chest congestion, wheezing, productive cough.  His symptoms have been present for the last 3 weeks when he started to get his symptoms which everybody else in the house had the same symptoms.  He denies fevers, chills, rhinorrhea, sinus pain/pressure.  Does have a productive cough with wheezing.  He does have a history of sarcoidosis ? ?Additionally, in February 2023 he was switched from Wellbutrin to Celexa for anxiety and depression.  His symptoms stemmed from stress with his marriage, he and his wife are now sleeping in the same bedroom and he looked at her more as a roommate than a partner.  At this time he was having difficulty sleeping not being able to "turn off his brain. ? ?He took a Celexa for roughly 6 to 7 weeks and could not get a refill at the pharmacy on the new prescription.  He has not taken the medication in about a week.  He reports that he did not notice much of a difference in his anxiety and depression while he was taking the Celexa.  His stress has improved and it sounds like his marriage is trying to be resolved.  Unfortunately, he continues to have issues with sleep, may be getting 2 to 3 hours of sleep per night.  He has been using over-the-counter Unisom, melatonin, and CBD without much improvement in his sleep.  He does fatigue throughout the day due to lack of rest. ? ?Review of Systems ?See HPI  ? ?Past Medical History:  ?Diagnosis Date  ? Allergy   ? Asthma   ? Sarcoidosis   ? ? ?Social History  ? ?Socioeconomic History  ? Marital status: Single  ?  Spouse name: Not on file  ? Number of children: Not on file  ? Years of education: Not on file  ? Highest education level: Master's degree (e.g., MA, MS, MEng, MEd, MSW, MBA)   ?Occupational History  ? Not on file  ?Tobacco Use  ? Smoking status: Former  ?  Packs/day: 0.25  ?  Years: 18.00  ?  Pack years: 4.50  ?  Types: Cigarettes  ?  Start date: 15  ?  Quit date: 2008  ?  Years since quitting: 15.3  ? Smokeless tobacco: Never  ?Substance and Sexual Activity  ? Alcohol use: Yes  ?  Alcohol/week: 0.0 standard drinks  ? Drug use: No  ? Sexual activity: Not on file  ?Other Topics Concern  ? Not on file  ?Social History Narrative  ? Not on file  ? ?Social Determinants of Health  ? ?Financial Resource Strain: Low Risk   ? Difficulty of Paying Living Expenses: Not hard at all  ?Food Insecurity: No Food Insecurity  ? Worried About Programme researcher, broadcasting/film/video in the Last Year: Never true  ? Ran Out of Food in the Last Year: Never true  ?Transportation Needs: No Transportation Needs  ? Lack of Transportation (Medical): No  ? Lack of Transportation (Non-Medical): No  ?Physical Activity: Insufficiently Active  ? Days of Exercise per Week: 2 days  ? Minutes of Exercise per Session: 60 min  ?Stress: Stress Concern Present  ? Feeling of Stress : Very much  ?  Social Connections: Moderately Isolated  ? Frequency of Communication with Friends and Family: Three times a week  ? Frequency of Social Gatherings with Friends and Family: Never  ? Attends Religious Services: Never  ? Active Member of Clubs or Organizations: No  ? Attends Banker Meetings: Not on file  ? Marital Status: Married  ?Intimate Partner Violence: Not on file  ? ? ?Past Surgical History:  ?Procedure Laterality Date  ? VASECTOMY    ? ? ?Family History  ?Problem Relation Age of Onset  ? Hypertension Other   ? ? ?No Known Allergies ? ?Current Outpatient Medications on File Prior to Visit  ?Medication Sig Dispense Refill  ? cetirizine (ZYRTEC) 10 MG tablet Take 10 mg by mouth as needed.     ? citalopram (CELEXA) 20 MG tablet Take 1 tablet (20 mg total) by mouth daily. 90 tablet 0  ? fluticasone (FLONASE) 50 MCG/ACT nasal spray Place 2  sprays into both nostrils daily. 16 g 5  ? nystatin (MYCOSTATIN/NYSTOP) powder Apply 1 application topically 3 (three) times daily. 60 g 0  ? ?No current facility-administered medications on file prior to visit.  ? ? ?BP 138/80   Pulse 93   Temp 98.4 ?F (36.9 ?C) (Oral)   Ht 6\' 1"  (1.854 m)   Wt 202 lb (91.6 kg)   SpO2 98%   BMI 26.65 kg/m?  ? ? ?   ?Objective:  ? Physical Exam ?Vitals and nursing note reviewed.  ?Constitutional:   ?   Appearance: Normal appearance.  ?Cardiovascular:  ?   Rate and Rhythm: Normal rate and regular rhythm.  ?   Pulses: Normal pulses.  ?   Heart sounds: Normal heart sounds.  ?Pulmonary:  ?   Effort: Pulmonary effort is normal.  ?   Breath sounds: Wheezing (trace throughout) present.  ?Musculoskeletal:     ?   General: Normal range of motion.  ?Skin: ?   General: Skin is warm and dry.  ?Neurological:  ?   General: No focal deficit present.  ?   Mental Status: He is alert and oriented to person, place, and time.  ?Psychiatric:     ?   Mood and Affect: Mood normal.     ?   Behavior: Behavior normal.     ?   Thought Content: Thought content normal.     ?   Judgment: Judgment normal.  ? ? ?   ?Assessment & Plan:  ?1. Respiratory infection ?-We will treat due to duration and history of sarcoidosis.  Will place on Zithromax and prednisone taper. ?- azithromycin (ZITHROMAX) 250 MG tablet; Take 2 tablets on day 1, then 1 tablet daily on days 2 through 5  Dispense: 6 tablet; Refill: 0 ?- predniSONE (DELTASONE) 10 MG tablet; 40 mg x 3 days, 20 mg x 3 days, 10 mg x 3 days  Dispense: 21 tablet; Refill: 0 ? ?2. Sleep disorder ?Trial him on trazodone.  He will follow-up with me in the next 2 to 3 weeks to let me know how he is doing ?- traZODone (DESYREL) 100 MG tablet; Take 1 tablet (100 mg total) by mouth at bedtime.  Dispense: 90 tablet; Refill: 0 ? ?3. Adjustment reaction with anxiety and depression ? ?- traZODone (DESYREL) 100 MG tablet; Take 1 tablet (100 mg total) by mouth at bedtime.   Dispense: 90 tablet; Refill: 0 ? ?Time of total for care on the day of the encounter 32 minutes. This includes time spent in  both face-to-face and non-face-to-face activities including preparing for the visit, reviewing the chart, time spent with patient, evaluation and counseling patient/family/caregiver, coordinating care, and time spent documenting in the chart which was performed on the date of service (12/27/2021). Note: this excludes any time spent performing billable procedures or separate charges (such as time spent counseling smoking cessation); these charges are billed separately.  ? ?

## 2022-01-09 ENCOUNTER — Encounter: Payer: Self-pay | Admitting: Adult Health

## 2022-01-10 ENCOUNTER — Encounter: Payer: Self-pay | Admitting: Adult Health

## 2022-01-10 ENCOUNTER — Telehealth (INDEPENDENT_AMBULATORY_CARE_PROVIDER_SITE_OTHER): Payer: 59 | Admitting: Adult Health

## 2022-01-10 VITALS — Ht 73.0 in | Wt 198.0 lb

## 2022-01-10 DIAGNOSIS — R42 Dizziness and giddiness: Secondary | ICD-10-CM

## 2022-01-10 NOTE — Telephone Encounter (Signed)
Pt has been scheduled.  °

## 2022-01-10 NOTE — Progress Notes (Signed)
Virtual Visit via Telephone Note ? ?I connected with Kevin Mcgee on 01/10/22 at 11:30 AM EDT by telephone and verified that I am speaking with the correct person using two identifiers. ?  ?I discussed the limitations, risks, security and privacy concerns of performing an evaluation and management service by telephone and the availability of in person appointments. I also discussed with the patient that there may be a patient responsible charge related to this service. The patient expressed understanding and agreed to proceed. ? ?Location patient: home ?Location provider: work or home office ?Participants present for the call: patient, provider ?Patient did not have a visit in the prior 7 days to address this/these issue(s). ? ? ?History of Present Illness: ?51 year old male who  has a past medical history of Allergy, Asthma, and Sarcoidosis. ? ?He is being evaluated today via phone visit for an acute issue of dizziness.  He reports that dizziness started soon after starting tramadol for insomnia.  Has been going on for roughly a week.  Dizziness usually starts in the late afternoon.  Does not feel like the world is spinning around him but does state "feels like my equilibrium is off".  Dizziness is pretty constant from 4 PM on.  Does not impede his ADLs stating "it is more of an annoyance than a problem". ? ?Syncopal episodes, nausea, or vomiting associated with dizziness. ?  ?Observations/Objective: ?Patient sounds cheerful and well on the phone. ?I do not appreciate any SOB. ?Speech and thought processing are grossly intact. ?Patient reported vitals: ? ?Assessment and Plan: ?1. Dizziness ?-[place] due to trazodone usage.  We will have him either cut back on trazodone by 50% or come off of it for the next few days and then can restart at 50% his dose.  If Zina's does not improve with stopping trazodone then he can trial using Dramamine as he could be experiencing mild vertigo.  Symptoms continued on follow-up  in the office. ? ? ?Follow Up Instructions: ? ? ?I did not refer this patient for an OV in the next 24 hours for this/these issue(s). ? ?I discussed the assessment and treatment plan with the patient. The patient was provided an opportunity to ask questions and all were answered. The patient agreed with the plan and demonstrated an understanding of the instructions. ?  ?The patient was advised to call back or seek an in-person evaluation if the symptoms worsen or if the condition fails to improve as anticipated. ? ?I provided 13 minutes of non-face-to-face time during this encounter. ? ? ?Shirline Frees, NP  ? ?

## 2022-03-02 ENCOUNTER — Ambulatory Visit
Admission: RE | Admit: 2022-03-02 | Discharge: 2022-03-02 | Disposition: A | Discharge status: Home / Self Care | Payer: 59 | Source: Ambulatory Visit | Attending: Urgent Care | Admitting: Urgent Care

## 2022-03-02 VITALS — BP 122/81 | HR 85 | Temp 98.1°F | Resp 20

## 2022-03-02 DIAGNOSIS — D869 Sarcoidosis, unspecified: Secondary | ICD-10-CM | POA: Diagnosis not present

## 2022-03-02 DIAGNOSIS — J209 Acute bronchitis, unspecified: Secondary | ICD-10-CM

## 2022-03-02 DIAGNOSIS — J3089 Other allergic rhinitis: Secondary | ICD-10-CM | POA: Diagnosis not present

## 2022-03-02 MED ORDER — MONTELUKAST SODIUM 10 MG PO TABS: 10.0000 mg | ORAL_TABLET | Freq: Every day | ORAL | 0 refills | Status: DC

## 2022-03-02 MED ORDER — ALBUTEROL SULFATE HFA 108 (90 BASE) MCG/ACT IN AERS: 2.0000 | INHALATION_SPRAY | Freq: Four times a day (QID) | RESPIRATORY_TRACT | 0 refills | Status: DC | PRN

## 2022-03-02 MED ORDER — ALBUTEROL SULFATE (2.5 MG/3ML) 0.083% IN NEBU
2.5000 mg | INHALATION_SOLUTION | Freq: Once | RESPIRATORY_TRACT | Status: AC
  Administered 2022-03-02: 2.5 mg via RESPIRATORY_TRACT

## 2022-03-02 MED ORDER — PREDNISONE 10 MG (21) PO TBPK: ORAL_TABLET | Freq: Every day | ORAL | 0 refills | Status: DC

## 2022-03-02 NOTE — ED Provider Notes (Signed)
[facility]    CSN: [id number] Arrival date & time: 03/02/22  0946      History   Chief Complaint Chief Complaint  Patient presents with   Nasal Congestion   Cough    HPI Kevin Mcgee is a 51 y.o. male.   Pleasant 51 year old male with a known history of sarcoidosis presents today with a 5-day history of nasal congestion, rhinorrhea, cough.  States he has seasonal allergies and takes Zyrtec on a daily basis.  He will switch to [facility] for "emergency use".  States 2 days ago he switched to the [place] but feels that his symptoms have persisted.  Was seen in April for similar symptoms, given azithromycin and prednisone pack.  Patient states this is helped in the past.  Patient believes however his symptoms never fully resolved from April.  Endorses tightness of chest and wheezing.  Mildly productive cough.  No fever, chest pain, palpitations. Hx smoking, quit >10 years ago.   Cough Associated symptoms: shortness of breath and wheezing     Past Medical History:  Diagnosis Date   Allergy    Asthma    Sarcoidosis     Patient Active Problem List   Diagnosis Date Noted   Contact dermatitis due to detergents 07/19/2014   Tendinitis of thumb 05/03/2014   Dysuria 05/19/2013   Acute prostatitis 05/19/2013   Contact dermatitis and eczema 10/21/2012   Dermatosis papulosa nigra 09/24/2012   Abnormal sensation of upper extremity 02/11/2012   CLUSTER HEADACHE 12/19/2009   OBSTRUCTIVE SLEEP APNEA 01/03/2009   ESOPHAGITIS, REFLUX 03/15/2008   ALLERGIC RHINITIS 02/01/2008   ASTHMA 02/01/2008   SARCOIDOSIS, PULMONARY 05/08/2007    Past Surgical History:  Procedure Laterality Date   VASECTOMY         Home Medications    Prior to Admission medications   Medication Sig Start Date End Date Taking? Authorizing Provider  albuterol (VENTOLIN HFA) 108 (90 Base) MCG/ACT inhaler Inhale 2 puffs into the lungs every 6 (six) hours as needed for wheezing or shortness of  breath. 03/02/22  Yes Avyn Aden L, PA  montelukast (SINGULAIR) 10 MG tablet Take 1 tablet (10 mg total) by mouth at bedtime. 03/02/22  Yes Tristin Vandeusen L, PA  predniSONE (STERAPRED UNI-PAK 21 TAB) 10 MG (21) TBPK tablet Take by mouth daily. Take 6 tabs by mouth daily  for 1 days, then 5 tabs for 1 days, then 4 tabs for 1 days, then 3 tabs for 1 days, 2 tabs for 1 days, then 1 tab by mouth daily for 1 days 03/02/22  Yes Damar Petit L, PA  cetirizine (ZYRTEC) 10 MG tablet Take 10 mg by mouth as needed.     [provider]  fluticasone (FLONASE) 50 MCG/ACT nasal spray Place 2 sprays into both nostrils daily. 01/06/18   Nafziger, Kandee Keen, NP    Family History Family History  Problem Relation Age of Onset   Hypertension Other     Social History Social History   Tobacco Use   Smoking status: Former    Packs/day: 0.25    Years: 18.00    Total pack years: 4.50    Types: Cigarettes    Start date: 22    Quit date: 2008    Years since quitting: 15.5   Smokeless tobacco: Never  Substance Use Topics   Alcohol use: Yes    Alcohol/week: 0.0 standard drinks of alcohol   Drug use: No     Allergies   Patient has  no known allergies.   Review of Systems Review of Systems  Respiratory:  Positive for cough, shortness of breath and wheezing.   Psychiatric/Behavioral:  Positive for sleep disturbance.   All other systems reviewed and are negative.    Physical Exam Triage Vital Signs ED Triage Vitals  Enc Vitals Group     BP 03/02/22 0959 122/81     Pulse Rate 03/02/22 0959 85     Resp 03/02/22 0959 20     Temp 03/02/22 0959 98.1 F (36.7 C)     Temp Source 03/02/22 0959 Oral     SpO2 03/02/22 0959 94 %     Weight --      Height --      Head Circumference --      Peak Flow --      Pain Score 03/02/22 1002 0     Pain Loc --      Pain Edu? --      Excl. in GC? --    No data found.  Updated Vital Signs BP 122/81 (BP Location: Right Arm)   Pulse 85   Temp 98.1 F  (36.7 C) (Oral)   Resp 20   SpO2 94%   Visual Acuity Right Eye Distance:   Left Eye Distance:   Bilateral Distance:    Right Eye Near:   Left Eye Near:    Bilateral Near:     Physical Exam Vitals and nursing note reviewed.  Constitutional:      General: He is not in acute distress.    Appearance: Normal appearance. He is well-developed and normal weight. He is not ill-appearing, toxic-appearing or diaphoretic.  HENT:     Head: Normocephalic and atraumatic.     Jaw: There is normal jaw occlusion.     Salivary Glands: Right salivary gland is not diffusely enlarged or tender. Left salivary gland is not diffusely enlarged or tender.     Right Ear: Ear canal and external ear normal. A middle ear effusion is present. Tympanic membrane is not injected, scarred, perforated, erythematous, retracted or bulging.     Left Ear: Ear canal and external ear normal. A middle ear effusion is present. Tympanic membrane is not injected, scarred, perforated, erythematous, retracted or bulging.     Nose: Congestion present. No nasal tenderness.     Right Nostril: No epistaxis.     Left Nostril: No epistaxis.     Right Turbinates: Swollen. Not enlarged.     Left Turbinates: Swollen. Not enlarged.     Right Sinus: No maxillary sinus tenderness or frontal sinus tenderness.     Left Sinus: No maxillary sinus tenderness or frontal sinus tenderness.     Mouth/Throat:     Lips: Pink.     Mouth: Mucous membranes are moist. No injury, lacerations, oral lesions or angioedema.     Dentition: Normal dentition.     Tongue: No lesions. Tongue does not deviate from midline.     Palate: No mass and lesions.     Pharynx: Oropharynx is clear. Uvula midline. No pharyngeal swelling, oropharyngeal exudate, posterior oropharyngeal erythema or uvula swelling.     Tonsils: No tonsillar exudate or tonsillar abscesses.  Eyes:     General: Lids are normal. No scleral icterus.       Right eye: No discharge.        Left  eye: No discharge.     Conjunctiva/sclera: Conjunctivae normal.     Right eye: Right conjunctiva is not injected.  Left eye: Left conjunctiva is not injected.  Cardiovascular:     Rate and Rhythm: Normal rate and regular rhythm.     Pulses: Normal pulses.     Heart sounds: No murmur heard. Pulmonary:     Effort: Pulmonary effort is normal. No respiratory distress.     Breath sounds: No stridor. Wheezing (severe wheezing in all lung fields prior to neg, significanltly improved s/p neb treatment) present. No rhonchi or rales.  Chest:     Chest wall: No tenderness.  Musculoskeletal:        General: No swelling.     Cervical back: Normal range of motion and neck supple. No rigidity or tenderness.  Lymphadenopathy:     Cervical: No cervical adenopathy.  Skin:    General: Skin is warm and dry.     Capillary Refill: Capillary refill takes less than 2 seconds.     Findings: No rash.  Neurological:     General: No focal deficit present.     Mental Status: He is alert and oriented to person, place, and time.  Psychiatric:        Mood and Affect: Mood normal.      UC Treatments / Results  Labs (all labs ordered are listed, but only abnormal results are displayed) Labs Reviewed - No data to display  EKG   Radiology No results found.  Procedures Procedures (including critical care time)  Medications Ordered in UC Medications  albuterol (PROVENTIL) (2.5 MG/3ML) 0.083% nebulizer solution 2.5 mg (2.5 mg Nebulization Given 03/02/22 1025)    Initial Impression / Assessment and Plan / UC Course  I have reviewed the triage vital signs and the nursing notes.  Pertinent labs & imaging results that were available during my care of the patient were reviewed by me and considered in my medical decision making (see chart for details).     Acute bronchitis - does not appear bacterial. I do not feel antibiotics are warranted at this time. Pt had significant response to albuterol in  office, O2 normalized s/p tx, will send pt home with albuterol inhaler to use 2 puffs every 6 hours around-the-clock for the first 1 to 3 days, then as needed.  We will also do the prednisone taper that patient has had in the past.  Sarcoidosis -chronic and stable. Allergic rhinitis -patient to continue his fexofenadine.  Will add montelukast at nighttime.  This should help with his respiratory status and hopefully his insomnia.  Follow-up with PCP should this become a chronic medication.  Final Clinical Impressions(s) / UC Diagnoses   Final diagnoses:  Acute bronchitis, unspecified organism  Sarcoidosis  Allergic rhinitis due to other allergic trigger, unspecified seasonality     Discharge Instructions      Your symptoms are consistent with acute bronchitis, which is an inflammatory reaction in your lungs.  This is almost always viral.  Antibiotics are usually not warranted. Due to the significant improvement after 1 treatment of albuterol in our office, I would recommend using a handheld Proventil inhaler.  Take 2 puffs every 6 hours around-the-clock for at least the first 24 hours.  You may benefit from doing this for the first 72 hours, then as needed. Take 1 tablet of montelukast every night. Start the prednisone taper as discussed.  Best taken in the morning to prevent worsening insomnia at night. Follow-up with your PCP should symptoms persist. If you develop severe shortness of breath or fever, please return to our clinic.  ED Prescriptions     Medication Sig Dispense Auth. Provider   albuterol (VENTOLIN HFA) 108 (90 Base) MCG/ACT inhaler Inhale 2 puffs into the lungs every 6 (six) hours as needed for wheezing or shortness of breath. 8 g Perrin Gens L, PA   predniSONE (STERAPRED UNI-PAK 21 TAB) 10 MG (21) TBPK tablet Take by mouth daily. Take 6 tabs by mouth daily  for 1 days, then 5 tabs for 1 days, then 4 tabs for 1 days, then 3 tabs for 1 days, 2 tabs for 1 days, then 1  tab by mouth daily for 1 days 21 tablet Jazmynn Pho L, PA   montelukast (SINGULAIR) 10 MG tablet Take 1 tablet (10 mg total) by mouth at bedtime. 30 tablet Josehua Hammar L, [place]      PDMP not reviewed this encounter.   Maretta Bees, [place] 03/02/22 1041

## 2022-03-02 NOTE — ED Triage Notes (Signed)
Pt here with allergies and chest congestion x 5 days and states when this happens it moves into his chest and his PCP prescribes Z-pack and prednisone, but he missed his doctor yesterday before he left the office.

## 2022-03-02 NOTE — Discharge Instructions (Addendum)
Your symptoms are consistent with acute bronchitis, which is an inflammatory reaction in your lungs.  This is almost always viral.  Antibiotics are usually not warranted. Due to the significant improvement after 1 treatment of albuterol in our office, I would recommend using a handheld Proventil inhaler.  Take 2 puffs every 6 hours around-the-clock for at least the first 24 hours.  You may benefit from doing this for the first 72 hours, then as needed. Take 1 tablet of montelukast every night. Start the prednisone taper as discussed.  Best taken in the morning to prevent worsening insomnia at night. Follow-up with your PCP should symptoms persist. If you develop severe shortness of breath or fever, please return to our clinic.

## 2022-03-06 ENCOUNTER — Encounter: Payer: Self-pay | Admitting: Adult Health

## 2022-04-23 ENCOUNTER — Encounter: Payer: Self-pay | Admitting: Adult Health

## 2022-04-24 ENCOUNTER — Telehealth (INDEPENDENT_AMBULATORY_CARE_PROVIDER_SITE_OTHER): Payer: 59 | Admitting: Adult Health

## 2022-04-24 ENCOUNTER — Encounter: Payer: Self-pay | Admitting: Adult Health

## 2022-04-24 VITALS — Ht 73.0 in | Wt 197.0 lb

## 2022-04-24 DIAGNOSIS — G4700 Insomnia, unspecified: Secondary | ICD-10-CM | POA: Diagnosis not present

## 2022-04-24 MED ORDER — BELSOMRA 15 MG PO TABS
15.0000 mg | ORAL_TABLET | Freq: Every evening | ORAL | 2 refills | Status: DC | PRN
Start: 2022-04-24 — End: 2022-09-24

## 2022-04-24 NOTE — Progress Notes (Signed)
Virtual Visit via Video Note  I connected with Kevin Mcgee on 04/24/22 at 11:30 AM EDT by a video enabled telemedicine application and verified that I am speaking with the correct person using two identifiers.  Location patient: home Location provider:work or home office Persons participating in the virtual visit: patient, provider  I discussed the limitations of evaluation and management by telemedicine and the availability of in person appointments. The patient expressed understanding and agreed to proceed.   HPI: 51 year old male who is being evaluated today for insomnia, this has been an ongoing issue.  Most recently had him try using trazodone but he did not tolerate this well and felt dizzy the next day.  He reports that he is having trouble staying asleep, often wakes up at the same time every morning.  Over the last 5 days he has maybe had 3 to 4 hours of sleep at night.  He denies signs of sleep apnea.  No new stress.  Does not have anxiety or depression.   ROS: See pertinent positives and negatives per HPI.  Past Medical History:  Diagnosis Date   Allergy    Asthma    Sarcoidosis     Past Surgical History:  Procedure Laterality Date   VASECTOMY      Family History  Problem Relation Age of Onset   Hypertension Other        Current Outpatient Medications:    albuterol (VENTOLIN HFA) 108 (90 Base) MCG/ACT inhaler, Inhale 2 puffs into the lungs every 6 (six) hours as needed for wheezing or shortness of breath., Disp: 8 g, Rfl: 0   cetirizine (ZYRTEC) 10 MG tablet, Take 10 mg by mouth as needed. , Disp: , Rfl:    fluticasone (FLONASE) 50 MCG/ACT nasal spray, Place 2 sprays into both nostrils daily., Disp: 16 g, Rfl: 5   montelukast (SINGULAIR) 10 MG tablet, Take 1 tablet (10 mg total) by mouth at bedtime., Disp: 30 tablet, Rfl: 0   Suvorexant (BELSOMRA) 15 MG TABS, Take 15 mg by mouth at bedtime as needed., Disp: 30 tablet, Rfl: 2  EXAM:  VITALS per patient if  applicable:  GENERAL: alert, oriented, appears well and in no acute distress  HEENT: atraumatic, conjunttiva clear, no obvious abnormalities on inspection of external nose and ears  NECK: normal movements of the head and neck  LUNGS: on inspection no signs of respiratory distress, breathing rate appears normal, no obvious gross SOB, gasping or wheezing  CV: no obvious cyanosis  MS: moves all visible extremities without noticeable abnormality  PSYCH/NEURO: pleasant and cooperative, no obvious depression or anxiety, speech and thought processing grossly intact  ASSESSMENT AND PLAN:  Discussed the following assessment and plan:  Insomnia, unspecified type - Plan: Suvorexant (BELSOMRA) 15 MG TABS - Will try him on Belsomra  - Follow up in one month or sooner if needed    I discussed the assessment and treatment plan with the patient. The patient was provided an opportunity to ask questions and all were answered. The patient agreed with the plan and demonstrated an understanding of the instructions.   The patient was advised to call back or seek an in-person evaluation if the symptoms worsen or if the condition fails to improve as anticipated.   Shirline Frees, NP

## 2022-04-26 ENCOUNTER — Telehealth: Payer: 59 | Admitting: Adult Health

## 2022-06-04 ENCOUNTER — Ambulatory Visit: Payer: 59 | Admitting: Adult Health

## 2022-06-04 ENCOUNTER — Encounter: Payer: Self-pay | Admitting: Adult Health

## 2022-06-04 VITALS — BP 122/78 | HR 88 | Temp 98.5°F | Wt 204.0 lb

## 2022-06-04 DIAGNOSIS — R5383 Other fatigue: Secondary | ICD-10-CM

## 2022-06-04 DIAGNOSIS — R519 Headache, unspecified: Secondary | ICD-10-CM

## 2022-06-04 LAB — POC COVID19 BINAXNOW: SARS Coronavirus 2 Ag: NEGATIVE

## 2022-06-04 LAB — POCT INFLUENZA A/B
Influenza A, POC: NEGATIVE
Influenza B, POC: NEGATIVE

## 2022-06-04 NOTE — Progress Notes (Signed)
Subjective:    Patient ID: Kevin Mcgee, male    DOB: 1971/07/31, 51 y.o.   MRN: [id number]  HPI 51 year old male who  has a past medical history of Allergy, Asthma, and Sarcoidosis.  He presents to the office today for an acute issue. His symptoms have been present for the last 6 days. Symptoms include that of headache, PND and muscle aches. Symptoms may be starting to improve.   He denies fevers, chills, n/v/d/ wheezing, shortness of breath.    Review of Systems See HPI   Past Medical History:  Diagnosis Date   Allergy    Asthma    Sarcoidosis     Social History   Socioeconomic History   Marital status: Single    Spouse name: Not on file   Number of children: Not on file   Years of education: Not on file   Highest education level: Master's degree (e.g., MA, MS, MEng, MEd, MSW, MBA)  Occupational History   Not on file  Tobacco Use   Smoking status: Former    Packs/day: 0.25    Years: 18.00    Total pack years: 4.50    Types: Cigarettes    Start date: 85    Quit date: 2008    Years since quitting: 15.7   Smokeless tobacco: Never  Substance and Sexual Activity   Alcohol use: Yes    Alcohol/week: 0.0 standard drinks of alcohol   Drug use: No   Sexual activity: Not on file  Other Topics Concern   Not on file  Social History Narrative   Not on file   Social Determinants of Health   Financial Resource Strain: Low Risk  (08/07/2021)   Overall Financial Resource Strain (CARDIA)    Difficulty of Paying Living Expenses: Not hard at all  Food Insecurity: No Food Insecurity (08/07/2021)   Hunger Vital Sign    Worried About Running Out of Food in the Last Year: Never true    Ran Out of Food in the Last Year: Never true  Transportation Needs: No Transportation Needs (08/07/2021)   PRAPARE - Hydrologist (Medical): No    Lack of Transportation (Non-Medical): No  Physical Activity: Insufficiently Active (08/07/2021)   Exercise Vital  Sign    Days of Exercise per Week: 2 days    Minutes of Exercise per Session: 60 min  Stress: Stress Concern Present (08/07/2021)   [place]    Feeling of Stress : Very much  Social Connections: Moderately Isolated (08/07/2021)   Social Connection and Isolation Panel [NHANES]    Frequency of Communication with Friends and Family: Three times a week    Frequency of Social Gatherings with Friends and Family: Never    Attends Religious Services: Never    Marine scientist or Organizations: No    Attends Music therapist: Not on file    Marital Status: Married  Human resources officer Violence: Not on file    Past Surgical History:  Procedure Laterality Date   VASECTOMY      Family History  Problem Relation Age of Onset   Hypertension Other     No Known Allergies  Current Outpatient Medications on File Prior to Visit  Medication Sig Dispense Refill   albuterol (VENTOLIN HFA) 108 (90 Base) MCG/ACT inhaler Inhale 2 puffs into the lungs every 6 (six) hours as needed for wheezing or shortness of breath. 8 g  0   cetirizine (ZYRTEC) 10 MG tablet Take 10 mg by mouth as needed.      fluticasone (FLONASE) 50 MCG/ACT nasal spray Place 2 sprays into both nostrils daily. 16 g 5   Suvorexant (BELSOMRA) 15 MG TABS Take 15 mg by mouth at bedtime as needed. 30 tablet 2   montelukast (SINGULAIR) 10 MG tablet Take 1 tablet (10 mg total) by mouth at bedtime. 30 tablet 0   No current facility-administered medications on file prior to visit.    BP 122/78 (BP Location: Left Arm, Patient Position: Sitting, Cuff Size: Large)   Pulse 88   Temp 98.5 F (36.9 C) (Oral)   Wt 204 lb (92.5 kg)   SpO2 97%   BMI 26.91 kg/m       Objective:   Physical Exam Vitals and nursing note reviewed.  Constitutional:      Appearance: Normal appearance. He is well-developed.  HENT:     Right Ear: Tympanic membrane, ear canal and  external ear normal. There is no impacted cerumen.     Left Ear: Tympanic membrane, ear canal and external ear normal. There is no impacted cerumen.     Nose: Nose normal. No congestion or rhinorrhea.     Mouth/Throat:     Mouth: Mucous membranes are moist.     Pharynx: Oropharynx is clear. No oropharyngeal exudate.  Eyes:     Extraocular Movements: Extraocular movements intact.     Conjunctiva/sclera: Conjunctivae normal.     Pupils: Pupils are equal, round, and reactive to light.  Cardiovascular:     Rate and Rhythm: Normal rate and regular rhythm.     Pulses: Normal pulses.     Heart sounds: Normal heart sounds.  Pulmonary:     Effort: Pulmonary effort is normal.     Breath sounds: Normal breath sounds.  Skin:    General: Skin is warm and dry.     Capillary Refill: Capillary refill takes less than 2 seconds.  Neurological:     General: No focal deficit present.     Mental Status: He is alert and oriented to person, place, and time.  Psychiatric:        Mood and Affect: Mood normal.        Behavior: Behavior normal.        Thought Content: Thought content normal.        Judgment: Judgment normal.        Assessment & Plan:  1. Nonintractable headache, unspecified chronicity pattern, unspecified headache type - Likely viral illness. No signs of bacterial. Advised rest and hydration.  - Follow up if not resolved in the next 3-4 days  - POC COVID-19- negative - POC Influenza A/B- negative  2. Other fatigue  - POC COVID-19 - POC Influenza A/B  Shirline Frees, NP

## 2022-06-12 ENCOUNTER — Other Ambulatory Visit: Payer: Self-pay

## 2022-06-12 ENCOUNTER — Encounter: Payer: Self-pay | Admitting: Adult Health

## 2022-06-12 MED ORDER — NYSTATIN 100000 UNIT/GM EX POWD
1.0000 | Freq: Three times a day (TID) | CUTANEOUS | 0 refills | Status: DC
Start: 2022-06-12 — End: 2023-07-09

## 2022-08-26 ENCOUNTER — Other Ambulatory Visit: Payer: Self-pay | Admitting: Adult Health

## 2022-08-26 DIAGNOSIS — F4323 Adjustment disorder with mixed anxiety and depressed mood: Secondary | ICD-10-CM

## 2022-08-26 DIAGNOSIS — G479 Sleep disorder, unspecified: Secondary | ICD-10-CM

## 2022-09-24 ENCOUNTER — Encounter: Payer: Self-pay | Admitting: Adult Health

## 2022-09-24 ENCOUNTER — Ambulatory Visit (INDEPENDENT_AMBULATORY_CARE_PROVIDER_SITE_OTHER): Payer: 59 | Admitting: Adult Health

## 2022-09-24 VITALS — BP 110/70 | HR 95 | Temp 97.9°F | Ht 73.0 in | Wt 207.0 lb

## 2022-09-24 DIAGNOSIS — J988 Other specified respiratory disorders: Secondary | ICD-10-CM

## 2022-09-24 MED ORDER — ALBUTEROL SULFATE HFA 108 (90 BASE) MCG/ACT IN AERS: 2.0000 | INHALATION_SPRAY | Freq: Four times a day (QID) | RESPIRATORY_TRACT | 0 refills | Status: AC | PRN

## 2022-09-24 NOTE — Progress Notes (Signed)
Subjective:    Patient ID: Kevin Mcgee, male    DOB: 1970/09/10, 52 y.o.   MRN: [id number]  HPI 52 year old male who  has a past medical history of Allergy, Asthma, and Sarcoidosis.  He presents to the office today for an acute issue. He was recently diagnosed with the flu about 10 days ago. He was prescribed Tamiflu and he took 3/5 days of treatment.  He is feeling better but continues to have some chest congestion, dry cough, and a mild headache  Review of Systems See HPI   Past Medical History:  Diagnosis Date   Allergy    Asthma    Sarcoidosis     Social History   Socioeconomic History   Marital status: Single    Spouse name: Not on file   Number of children: Not on file   Years of education: Not on file   Highest education level: Master's degree (e.g., MA, MS, MEng, MEd, MSW, MBA)  Occupational History   Not on file  Tobacco Use   Smoking status: Former    Packs/day: 0.25    Years: 18.00    Total pack years: 4.50    Types: Cigarettes    Start date: 51    Quit date: 2008    Years since quitting: 16.0   Smokeless tobacco: Never  Substance and Sexual Activity   Alcohol use: Yes    Alcohol/week: 0.0 standard drinks of alcohol   Drug use: No   Sexual activity: Not on file  Other Topics Concern   Not on file  Social History Narrative   Not on file   Social Determinants of Health   Financial Resource Strain: Low Risk  (08/07/2021)   Overall Financial Resource Strain (CARDIA)    Difficulty of Paying Living Expenses: Not hard at all  Food Insecurity: No Food Insecurity (08/07/2021)   Hunger Vital Sign    Worried About Running Out of Food in the Last Year: Never true    Ran Out of Food in the Last Year: Never true  Transportation Needs: No Transportation Needs (08/07/2021)   PRAPARE - Hydrologist (Medical): No    Lack of Transportation (Non-Medical): No  Physical Activity: Insufficiently Active (08/07/2021)   Exercise Vital  Sign    Days of Exercise per Week: 2 days    Minutes of Exercise per Session: 60 min  Stress: Stress Concern Present (08/07/2021)   [place]    Feeling of Stress : Very much  Social Connections: Moderately Isolated (08/07/2021)   Social Connection and Isolation Panel [NHANES]    Frequency of Communication with Friends and Family: Three times a week    Frequency of Social Gatherings with Friends and Family: Never    Attends Religious Services: Never    Marine scientist or Organizations: No    Attends Music therapist: Not on file    Marital Status: Married  Human resources officer Violence: Not on file    Past Surgical History:  Procedure Laterality Date   VASECTOMY      Family History  Problem Relation Age of Onset   Hypertension Other     No Known Allergies  Current Outpatient Medications on File Prior to Visit  Medication Sig Dispense Refill   albuterol (VENTOLIN HFA) 108 (90 Base) MCG/ACT inhaler Inhale 2 puffs into the lungs every 6 (six) hours as needed for wheezing or shortness of breath.  8 g 0   cetirizine (ZYRTEC) 10 MG tablet Take 10 mg by mouth as needed.      fluticasone (FLONASE) 50 MCG/ACT nasal spray Place 2 sprays into both nostrils daily. 16 g 5   nystatin (MYCOSTATIN/NYSTOP) powder Apply 1 Application topically 3 (three) times daily. 60 g 0   Suvorexant (BELSOMRA) 15 MG TABS Take 15 mg by mouth at bedtime as needed. 30 tablet 2   No current facility-administered medications on file prior to visit.    BP 110/70   Pulse 95   Temp 97.9 F (36.6 C) (Oral)   Ht 6\' 1"  (1.854 m)   Wt 207 lb (93.9 kg)   SpO2 97%   BMI 27.31 kg/m       Objective:   Physical Exam Vitals and nursing note reviewed.  Constitutional:      Appearance: Normal appearance.  Cardiovascular:     Rate and Rhythm: Normal rate and regular rhythm.     Pulses: Normal pulses.     Heart sounds: Normal heart  sounds.  Pulmonary:     Effort: Pulmonary effort is normal.     Breath sounds: Wheezing (trace wheezing throughout) present.  Musculoskeletal:        General: Normal range of motion.  Skin:    General: Skin is warm and dry.     Capillary Refill: Capillary refill takes less than 2 seconds.  Neurological:     General: No focal deficit present.     Mental Status: He is alert and oriented to person, place, and time.  Psychiatric:        Mood and Affect: Mood normal.        Behavior: Behavior normal.        Thought Content: Thought content normal.        Assessment & Plan:  1. Respiratory infection  - albuterol (VENTOLIN HFA) 108 (90 Base) MCG/ACT inhaler; Inhale 2 puffs into the lungs every 6 (six) hours as needed for wheezing or shortness of breath.  Dispense: 8 g; Refill: 0  Dorothyann Peng, NP

## 2022-10-08 ENCOUNTER — Other Ambulatory Visit: Payer: Self-pay | Admitting: Adult Health

## 2022-10-08 DIAGNOSIS — G479 Sleep disorder, unspecified: Secondary | ICD-10-CM

## 2022-10-08 DIAGNOSIS — F4323 Adjustment disorder with mixed anxiety and depressed mood: Secondary | ICD-10-CM

## 2022-10-11 ENCOUNTER — Ambulatory Visit (INDEPENDENT_AMBULATORY_CARE_PROVIDER_SITE_OTHER): Payer: 59 | Admitting: Adult Health

## 2022-10-11 ENCOUNTER — Encounter: Payer: Self-pay | Admitting: Adult Health

## 2022-10-11 VITALS — BP 100/72 | HR 98 | Temp 97.8°F | Ht 73.0 in | Wt 208.0 lb

## 2022-10-11 DIAGNOSIS — G4700 Insomnia, unspecified: Secondary | ICD-10-CM | POA: Diagnosis not present

## 2022-10-11 DIAGNOSIS — T148XXA Other injury of unspecified body region, initial encounter: Secondary | ICD-10-CM

## 2022-10-11 MED ORDER — TRAZODONE HCL 100 MG PO TABS: 100.0000 mg | ORAL_TABLET | Freq: Every day | ORAL | 1 refills | Status: DC

## 2022-10-11 MED ORDER — CYCLOBENZAPRINE HCL 10 MG PO TABS: 10.0000 mg | ORAL_TABLET | Freq: Three times a day (TID) | ORAL | 0 refills | Status: DC
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
## 2023-02-06 ENCOUNTER — Encounter: Payer: Self-pay | Admitting: Adult Health

## 2023-02-11 ENCOUNTER — Ambulatory Visit: Payer: 59 | Admitting: Adult Health

## 2023-02-20 ENCOUNTER — Encounter: Payer: Self-pay | Admitting: Adult Health

## 2023-02-20 ENCOUNTER — Ambulatory Visit: Payer: 59 | Admitting: Adult Health

## 2023-02-20 VITALS — BP 120/80 | HR 100 | Temp 98.2°F | Ht 73.0 in | Wt 211.0 lb

## 2023-02-20 DIAGNOSIS — L239 Allergic contact dermatitis, unspecified cause: Secondary | ICD-10-CM | POA: Diagnosis not present

## 2023-02-20 MED ORDER — PREDNISONE 10 MG PO TABS: 10.0000 mg | ORAL_TABLET | Freq: Every day | ORAL | 0 refills | Status: DC

## 2023-02-20 NOTE — Progress Notes (Signed)
Subjective:    Patient ID: Kevin Mcgee, male    DOB: 15-Feb-1971, 52 y.o.   MRN: [id number]  HPI 52 year old male who  has a past medical history of Allergy, Asthma, and Sarcoidosis.  He presents to the office today for an acute issue. He reports that over the last month he has developed a rash on his neck and wrist. Rash is itchy. Worse in the sun and in hot showers. He has not changed soaps, detergents, lotions, or diet. He does take zyrtec daily.   At home he has been using OTC hydrocortisone cream without some mild relief.    Review of Systems See HPI   Past Medical History:  Diagnosis Date   Allergy    Asthma    Sarcoidosis     Social History   Socioeconomic History   Marital status: Single    Spouse name: Not on file   Number of children: Not on file   Years of education: Not on file   Highest education level: Master's degree (e.g., MA, MS, MEng, MEd, MSW, MBA)  Occupational History   Not on file  Tobacco Use   Smoking status: Former    Packs/day: 0.25    Years: 18.00    Additional pack years: 0.00    Total pack years: 4.50    Types: Cigarettes    Start date: 18    Quit date: 2008    Years since quitting: 16.4   Smokeless tobacco: Never  Substance and Sexual Activity   Alcohol use: Yes    Alcohol/week: 0.0 standard drinks of alcohol   Drug use: No   Sexual activity: Not on file  Other Topics Concern   Not on file  Social History Narrative   Not on file   Social Determinants of Health   Financial Resource Strain: Low Risk  (08/07/2021)   Overall Financial Resource Strain (CARDIA)    Difficulty of Paying Living Expenses: Not hard at all  Food Insecurity: No Food Insecurity (08/07/2021)   Hunger Vital Sign    Worried About Running Out of Food in the Last Year: Never true    Ran Out of Food in the Last Year: Never true  Transportation Needs: No Transportation Needs (08/07/2021)   PRAPARE - Administrator, Civil Service (Medical): No     Lack of Transportation (Non-Medical): No  Physical Activity: Insufficiently Active (08/07/2021)   Exercise Vital Sign    Days of Exercise per Week: 2 days    Minutes of Exercise per Session: 60 min  Stress: Stress Concern Present (08/07/2021)   [employer] of Occupational Health - Occupational Stress Questionnaire    Feeling of Stress : Very much  Social Connections: Moderately Isolated (08/07/2021)   Social Connection and Isolation Panel [NHANES]    Frequency of Communication with Friends and Family: Three times a week    Frequency of Social Gatherings with Friends and Family: Never    Attends Religious Services: Never    Database administrator or Organizations: No    Attends Engineer, structural: Not on file    Marital Status: Married  Catering manager Violence: Not on file    Past Surgical History:  Procedure Laterality Date   VASECTOMY      Family History  Problem Relation Age of Onset   Hypertension Other     No Known Allergies  Current Outpatient Medications on File Prior to Visit  Medication Sig Dispense Refill  albuterol (VENTOLIN HFA) 108 (90 Base) MCG/ACT inhaler Inhale 2 puffs into the lungs every 6 (six) hours as needed for wheezing or shortness of breath. 8 g 0   cetirizine (ZYRTEC) 10 MG tablet Take 10 mg by mouth as needed.      cyclobenzaprine (FLEXERIL) 10 MG tablet Take 1 tablet (10 mg total) by mouth 3 (three) times daily. 15 tablet 0   fluticasone (FLONASE) 50 MCG/ACT nasal spray Place 2 sprays into both nostrils daily. 16 g 5   nystatin (MYCOSTATIN/NYSTOP) powder Apply 1 Application topically 3 (three) times daily. 60 g 0   traZODone (DESYREL) 100 MG tablet Take 1 tablet (100 mg total) by mouth at bedtime. 90 tablet 1   No current facility-administered medications on file prior to visit.    BP 120/80   Pulse 100   Temp 98.2 F (36.8 C) (Oral)   Ht 6\' 1"  (1.854 m)   Wt 211 lb (95.7 kg)   SpO2 96%   BMI 27.84 kg/m        Objective:   Physical Exam Vitals and nursing note reviewed.  Constitutional:      Appearance: Normal appearance.  Skin:    General: Skin is warm and dry.     Findings: Rash present.       Neurological:     Mental Status: He is alert and oriented to person, place, and time.  Psychiatric:        Mood and Affect: Mood normal.        Behavior: Behavior normal.        Thought Content: Thought content normal.        Judgment: Judgment normal.        Assessment & Plan:  1. Allergic contact dermatitis, unspecified trigger - Continue with Zyrtec  - Will send in 7 day course of prednisone, take one tablet daily.  - Follow up if not resolved  - predniSONE (DELTASONE) 10 MG tablet; Take 1 tablet (10 mg total) by mouth daily with breakfast.  Dispense: 7 tablet; Refill: 0  Shirline Frees, NP

## 2023-03-05 ENCOUNTER — Encounter: Payer: Self-pay | Admitting: Adult Health

## 2023-03-07 NOTE — Telephone Encounter (Signed)
Pt stated that he took some old prednisone but there was no relief. However, pt tried to pick up new Rx that was originally sent in for this issue but rx expired. Pt is wanting a new Rx sent in. I advised pt per Cory's note that pt was suppose to take 1 round of prednisone and f/u if no better. Pt advied that I will sent to [facility] to see if another round is appropriate without an appt. Pt verbalized understanding. Tried to schedule pt with another provider but he declined and stated it can wait.

## 2023-04-23 ENCOUNTER — Ambulatory Visit: Payer: 59 | Admitting: Adult Health

## 2023-05-27 ENCOUNTER — Encounter: Payer: Self-pay | Admitting: Adult Health

## 2023-05-27 ENCOUNTER — Ambulatory Visit: Payer: 59 | Admitting: Adult Health

## 2023-05-27 VITALS — BP 120/70 | HR 105 | Temp 98.5°F | Ht 73.0 in | Wt 209.0 lb

## 2023-05-27 DIAGNOSIS — R1084 Generalized abdominal pain: Secondary | ICD-10-CM | POA: Diagnosis not present

## 2023-05-27 DIAGNOSIS — M25551 Pain in right hip: Secondary | ICD-10-CM

## 2023-05-27 NOTE — Progress Notes (Signed)
Subjective:    Patient ID: Kevin Mcgee, male    DOB: 07/11/1971, 52 y.o.   MRN: [id number]  HPI  52 year old male who  has a past medical history of Allergy, Asthma, and Sarcoidosis.  He presents to the office today for multiple issues.   He reports that about a month ago he developed a sharp pain around his left hip. He does not feel the pain when he is walking or changing positions. Pain does happen when he is laying on his right and left side.Denies catching sensation. Denies trauma or aggravating injury. This pain is felt as a sharp pain.   He also reports that over the last week or so he has developed sharp discomfort that seems to travel around the lateral of his rib cage into the bilateral flanks and upper back.  This pain is worse with laying flat as well as with stretching his torso.  He does not have any history of kidney stones.  He denies blood in his urine, UTI-like symptoms, fevers, or chills.  Both pains seem to be coming from " inside."    Review of Systems See HPI   Past Medical History:  Diagnosis Date   Allergy    Asthma    Sarcoidosis     Social History   Socioeconomic History   Marital status: Single    Spouse name: Not on file   Number of children: Not on file   Years of education: Not on file   Highest education level: Master's degree (e.g., MA, MS, MEng, MEd, MSW, MBA)  Occupational History   Not on file  Tobacco Use   Smoking status: Former    Current packs/day: 0.00    Average packs/day: 0.3 packs/day for 18.0 years (4.5 ttl pk-yrs)    Types: Cigarettes    Start date: 73    Quit date: 2008    Years since quitting: 16.7   Smokeless tobacco: Never  Substance and Sexual Activity   Alcohol use: Yes    Alcohol/week: 0.0 standard drinks of alcohol   Drug use: No   Sexual activity: Not on file  Other Topics Concern   Not on file  Social History Narrative   Not on file   Social Determinants of Health   Financial Resource Strain:  Low Risk  (08/07/2021)   Overall Financial Resource Strain (CARDIA)    Difficulty of Paying Living Expenses: Not hard at all  Food Insecurity: No Food Insecurity (08/07/2021)   Hunger Vital Sign    Worried About Running Out of Food in the Last Year: Never true    Ran Out of Food in the Last Year: Never true  Transportation Needs: No Transportation Needs (08/07/2021)   PRAPARE - Administrator, Civil Service (Medical): No    Lack of Transportation (Non-Medical): No  Physical Activity: Insufficiently Active (08/07/2021)   Exercise Vital Sign    Days of Exercise per Week: 2 days    Minutes of Exercise per Session: 60 min  Stress: Stress Concern Present (08/07/2021)   [employer] of Occupational Health - Occupational Stress Questionnaire    Feeling of Stress : Very much  Social Connections: Moderately Isolated (08/07/2021)   Social Connection and Isolation Panel [NHANES]    Frequency of Communication with Friends and Family: Three times a week    Frequency of Social Gatherings with Friends and Family: Never    Attends Religious Services: Never    Active Member of Clubs or  Organizations: No    Attends Engineer, structural: Not on file    Marital Status: Married  Catering manager Violence: Not on file    Past Surgical History:  Procedure Laterality Date   VASECTOMY      Family History  Problem Relation Age of Onset   Hypertension Other     No Known Allergies  Current Outpatient Medications on File Prior to Visit  Medication Sig Dispense Refill   albuterol (VENTOLIN HFA) 108 (90 Base) MCG/ACT inhaler Inhale 2 puffs into the lungs every 6 (six) hours as needed for wheezing or shortness of breath. 8 g 0   cetirizine (ZYRTEC) 10 MG tablet Take 10 mg by mouth as needed.      cyclobenzaprine (FLEXERIL) 10 MG tablet Take 1 tablet (10 mg total) by mouth 3 (three) times daily. 15 tablet 0   fluticasone (FLONASE) 50 MCG/ACT nasal spray Place 2 sprays into both  nostrils daily. 16 g 5   nystatin (MYCOSTATIN/NYSTOP) powder Apply 1 Application topically 3 (three) times daily. 60 g 0   predniSONE (DELTASONE) 10 MG tablet Take 1 tablet (10 mg total) by mouth daily with breakfast. 7 tablet 0   traZODone (DESYREL) 100 MG tablet Take 1 tablet (100 mg total) by mouth at bedtime. 90 tablet 1   No current facility-administered medications on file prior to visit.    BP 120/70   Pulse (!) 105   Temp 98.5 F (36.9 C) (Oral)   Ht 6\' 1"  (1.854 m)   Wt 209 lb (94.8 kg)   SpO2 96%   BMI 27.57 kg/m       Objective:   Physical Exam Vitals and nursing note reviewed.  Constitutional:      Appearance: He is well-developed.  Cardiovascular:     Rate and Rhythm: Normal rate and regular rhythm.     Pulses: Normal pulses.     Heart sounds: Normal heart sounds.  Pulmonary:     Effort: Pulmonary effort is normal.     Breath sounds: Normal breath sounds.  Abdominal:     General: Abdomen is flat. Bowel sounds are normal. There is no distension.     Palpations: Abdomen is soft. There is no mass.     Tenderness: There is no abdominal tenderness. There is no right CVA tenderness or left CVA tenderness.  Musculoskeletal:        General: Normal range of motion.  Skin:    General: Skin is warm and dry.  Neurological:     General: No focal deficit present.  Psychiatric:        Mood and Affect: Mood normal.        Behavior: Behavior normal.        Thought Content: Thought content normal.        Judgment: Judgment normal.       Assessment & Plan:   1. Right hip pain  - DG Abd 1 View; Future - Urinalysis; Future - Urinalysis  2. Generalized abdominal pain - Possible kidney stones. Will start with KUB  - DG Hip Unilat W OR W/O Pelvis 2-3 Views Left; Future - Urinalysis; Future - Urinalysis   Shirline Frees, NP

## 2023-05-28 ENCOUNTER — Ambulatory Visit: Payer: 59

## 2023-05-28 ENCOUNTER — Ambulatory Visit: Payer: 59 | Admitting: Adult Health

## 2023-05-28 DIAGNOSIS — M25552 Pain in left hip: Secondary | ICD-10-CM | POA: Diagnosis not present

## 2023-05-28 DIAGNOSIS — R1084 Generalized abdominal pain: Secondary | ICD-10-CM | POA: Diagnosis not present

## 2023-05-28 DIAGNOSIS — M25551 Pain in right hip: Secondary | ICD-10-CM

## 2023-05-28 LAB — URINALYSIS
Bilirubin Urine: NEGATIVE
Hgb urine dipstick: NEGATIVE
Ketones, ur: NEGATIVE
Leukocytes,Ua: NEGATIVE
Nitrite: NEGATIVE
Specific Gravity, Urine: 1.02 (ref 1.000–1.030)
Total Protein, Urine: NEGATIVE
Urine Glucose: NEGATIVE
Urobilinogen, UA: 0.2 (ref 0.0–1.0)
pH: 7 (ref 5.0–8.0)

## 2023-06-08 ENCOUNTER — Encounter: Payer: Self-pay | Admitting: Adult Health

## 2023-07-09 ENCOUNTER — Ambulatory Visit: Payer: 59 | Admitting: Adult Health

## 2023-07-09 ENCOUNTER — Encounter: Payer: Self-pay | Admitting: Adult Health

## 2023-07-09 VITALS — BP 110/80 | HR 96 | Temp 98.7°F | Ht 73.0 in | Wt 210.0 lb

## 2023-07-09 DIAGNOSIS — T148XXA Other injury of unspecified body region, initial encounter: Secondary | ICD-10-CM | POA: Diagnosis not present

## 2023-07-09 DIAGNOSIS — M67952 Unspecified disorder of synovium and tendon, left thigh: Secondary | ICD-10-CM

## 2023-07-09 MED ORDER — CYCLOBENZAPRINE HCL 10 MG PO TABS
10.0000 mg | ORAL_TABLET | Freq: Three times a day (TID) | ORAL | 0 refills | Status: DC
Start: 2023-07-09 — End: 2024-04-08

## 2023-07-09 NOTE — Progress Notes (Signed)
Subjective:    Patient ID: Kevin Mcgee, male    DOB: 1971/07/08, 52 y.o.   MRN: [id number]  HPI  52 year old male who  has a past medical history of Allergy, Asthma, and Sarcoidosis.  He presents to the office today for pain in his left buttocks.  He reports that his symptoms started 3 days ago after sleeping in a hotel.  Pain does not radiate down his leg.  Pain is described as sharp and aching.  Pain worse when sitting for extended periods of time and he tries to change positions.  Yesterday was the worst and his pain was 10 out of 10.  Today his pain has improved to about 6 out of 10.  At home he has been using naproxen, heat/ice.  He has noticed that ice causes worse discomfort.  Review of Systems See HPI   Past Medical History:  Diagnosis Date   Allergy    Asthma    Sarcoidosis     Social History   Socioeconomic History   Marital status: Married    Spouse name: Not on file   Number of children: Not on file   Years of education: Not on file   Highest education level: Master's degree (e.g., MA, MS, MEng, MEd, MSW, MBA)  Occupational History   Not on file  Tobacco Use   Smoking status: Former    Current packs/day: 0.00    Average packs/day: 0.3 packs/day for 18.0 years (4.5 ttl pk-yrs)    Types: Cigarettes    Start date: 36    Quit date: 2008    Years since quitting: 16.8   Smokeless tobacco: Never  Substance and Sexual Activity   Alcohol use: Yes    Alcohol/week: 0.0 standard drinks of alcohol   Drug use: No   Sexual activity: Not on file  Other Topics Concern   Not on file  Social History Narrative   Not on file   Social Determinants of Health   Financial Resource Strain: Low Risk  (08/07/2021)   Overall Financial Resource Strain (CARDIA)    Difficulty of Paying Living Expenses: Not hard at all  Food Insecurity: No Food Insecurity (08/07/2021)   Hunger Vital Sign    Worried About Running Out of Food in the Last Year: Never true    Ran Out of Food in  the Last Year: Never true  Transportation Needs: No Transportation Needs (08/07/2021)   PRAPARE - Administrator, Civil Service (Medical): No    Lack of Transportation (Non-Medical): No  Physical Activity: Insufficiently Active (08/07/2021)   Exercise Vital Sign    Days of Exercise per Week: 2 days    Minutes of Exercise per Session: 60 min  Stress: Stress Concern Present (08/07/2021)   [employer] of Occupational Health - Occupational Stress Questionnaire    Feeling of Stress : Very much  Social Connections: Moderately Isolated (08/07/2021)   Social Connection and Isolation Panel [NHANES]    Frequency of Communication with Friends and Family: Three times a week    Frequency of Social Gatherings with Friends and Family: Never    Attends Religious Services: Never    Database administrator or Organizations: No    Attends Engineer, structural: Not on file    Marital Status: Married  Catering manager Violence: Not on file    Past Surgical History:  Procedure Laterality Date   VASECTOMY      Family History  Problem Relation Age  of Onset   Hypertension Other     No Known Allergies  Current Outpatient Medications on File Prior to Visit  Medication Sig Dispense Refill   albuterol (VENTOLIN HFA) 108 (90 Base) MCG/ACT inhaler Inhale 2 puffs into the lungs every 6 (six) hours as needed for wheezing or shortness of breath. 8 g 0   cetirizine (ZYRTEC) 10 MG tablet Take 10 mg by mouth as needed.      cyclobenzaprine (FLEXERIL) 10 MG tablet Take 1 tablet (10 mg total) by mouth 3 (three) times daily. 15 tablet 0   fluticasone (FLONASE) 50 MCG/ACT nasal spray Place 2 sprays into both nostrils daily. 16 g 5   nystatin (MYCOSTATIN/NYSTOP) powder Apply 1 Application topically 3 (three) times daily. 60 g 0   predniSONE (DELTASONE) 10 MG tablet Take 1 tablet (10 mg total) by mouth daily with breakfast. 7 tablet 0   traZODone (DESYREL) 100 MG tablet Take 1 tablet (100 mg  total) by mouth at bedtime. 90 tablet 1   No current facility-administered medications on file prior to visit.    BP 110/80   Pulse 96   Temp 98.7 F (37.1 C) (Oral)   Ht 6\' 1"  (1.854 m)   Wt 210 lb (95.3 kg)   SpO2 99%   BMI 27.71 kg/m       Objective:   Physical Exam Vitals and nursing note reviewed.  Constitutional:      Appearance: Normal appearance.  Musculoskeletal:        General: Tenderness (He has tenderness with palpation along the gluteal tendon gluteus maximus.) present. No swelling or deformity. Normal range of motion.     Left hip: Tenderness present. No deformity or bony tenderness. Normal range of motion. Normal strength.  Skin:    General: Skin is warm and dry.     Capillary Refill: Capillary refill takes less than 2 seconds.  Neurological:     General: No focal deficit present.     Mental Status: He is alert and oriented to person, place, and time.  Psychiatric:        Mood and Affect: Mood normal.        Behavior: Behavior normal.        Thought Content: Thought content normal.        Judgment: Judgment normal.       Assessment & Plan:   1. Tendinopathy of left gluteal region -Appears to be tendinopathy but will send in Flexeril in case there is some muscular involvement.  He continue with heat and naproxen until symptoms improve further.  Follow-up if not improving over the next week or 2. - cyclobenzaprine (FLEXERIL) 10 MG tablet; Take 1 tablet (10 mg total) by mouth 3 (three) times daily.  Dispense: 15 tablet; Refill: 0  Shirline Frees, NP

## 2023-08-07 ENCOUNTER — Ambulatory Visit: Payer: 59 | Admitting: Adult Health

## 2024-03-30 ENCOUNTER — Ambulatory Visit: Admitting: Adult Health

## 2024-04-08 ENCOUNTER — Encounter: Payer: Self-pay | Admitting: Adult Health

## 2024-04-08 ENCOUNTER — Ambulatory Visit: Admitting: Adult Health

## 2024-04-08 VITALS — BP 120/70 | HR 97 | Temp 98.2°F | Ht 73.0 in | Wt 210.0 lb

## 2024-04-08 DIAGNOSIS — R21 Rash and other nonspecific skin eruption: Secondary | ICD-10-CM | POA: Diagnosis not present

## 2024-04-08 DIAGNOSIS — R632 Polyphagia: Secondary | ICD-10-CM

## 2024-04-08 DIAGNOSIS — R5383 Other fatigue: Secondary | ICD-10-CM

## 2024-04-08 MED ORDER — TRIAMCINOLONE ACETONIDE 0.5 % EX OINT
1.0000 | TOPICAL_OINTMENT | Freq: Two times a day (BID) | CUTANEOUS | 0 refills | Status: AC
Start: 2024-04-08 — End: ?

## 2024-04-08 NOTE — Progress Notes (Signed)
 Subjective:    Patient ID: Kevin Mcgee, male    DOB: 12-12-1970, 53 y.o.   MRN: [id number]  HPI 53 year old male who  has a past medical history of Allergy, Asthma, and Sarcoidosis.  He presents to the office today for multiple issues   His first issue is that of chronic fatigue.  He states that he feels tired all the time.  He wakes up feeling tired, and feels tired throughout the day until he gets home at night and goes to bed.  If he could he would take an afternoon nap.  He does report having a sleep study done 15 to 20 years ago showed probable mild sleep apnea.  Does snore but denies any apneic episodes.  Has not fallen asleep while driving  He also reports that over the last few months he will be eating and will eat his normal meal but will feel hungry when he is finished not been the case in the past.  He denies diarrhea or constipation has not had any abdominal pain, fevers, or chills.   He does report a lot of stress at home with his marriage, college tuition and his job.  He also has a rash on his right forearm that has been present for an unknown amount of time.  He has used creams that he has at home but the sound to be more antifungal in nature.  Rash seems to be more pronounced in the morning or after he showers. He does not endorse any itching or burning.     Review of Systems See HPI   Past Medical History:  Diagnosis Date   Allergy    Asthma    Sarcoidosis     Social History   Socioeconomic History   Marital status: Married    Spouse name: Not on file   Number of children: Not on file   Years of education: Not on file   Highest education level: Master's degree (e.g., MA, MS, MEng, MEd, MSW, MBA)  Occupational History   Not on file  Tobacco Use   Smoking status: Former    Current packs/day: 0.00    Average packs/day: 0.3 packs/day for 18.0 years (4.5 ttl pk-yrs)    Types: Cigarettes    Start date: 85    Quit date: 2008    Years since  quitting: 17.6   Smokeless tobacco: Never  Substance and Sexual Activity   Alcohol use: Yes    Alcohol/week: 0.0 standard drinks of alcohol   Drug use: No   Sexual activity: Not on file  Other Topics Concern   Not on file  Social History Narrative   Not on file   Social Drivers of Health   Financial Resource Strain: Low Risk  (08/07/2021)   Overall Financial Resource Strain (CARDIA)    Difficulty of Paying Living Expenses: Not hard at all  Food Insecurity: No Food Insecurity (08/07/2021)   Hunger Vital Sign    Worried About Running Out of Food in the Last Year: Never true    Ran Out of Food in the Last Year: Never true  Transportation Needs: No Transportation Needs (08/07/2021)   PRAPARE - Administrator, Civil Service (Medical): No    Lack of Transportation (Non-Medical): No  Physical Activity: Insufficiently Active (08/07/2021)   Exercise Vital Sign    Days of Exercise per Week: 2 days    Minutes of Exercise per Session: 60 min  Stress: Stress Concern Present (08/07/2021)  [employer] of Occupational Health - Occupational Stress Questionnaire    Feeling of Stress : Very much  Social Connections: Moderately Isolated (08/07/2021)   Social Connection and Isolation Panel    Frequency of Communication with Friends and Family: Three times a week    Frequency of Social Gatherings with Friends and Family: Never    Attends Religious Services: Never    Database administrator or Organizations: No    Attends Engineer, structural: Not on file    Marital Status: Married  Catering manager Violence: Not on file    Past Surgical History:  Procedure Laterality Date   VASECTOMY      Family History  Problem Relation Age of Onset   Hypertension Other     No Known Allergies  Current Outpatient Medications on File Prior to Visit  Medication Sig Dispense Refill   albuterol  (VENTOLIN  HFA) 108 (90 Base) MCG/ACT inhaler Inhale 2 puffs into the lungs every 6 (six)  hours as needed for wheezing or shortness of breath. 8 g 0   cetirizine (ZYRTEC) 10 MG tablet Take 10 mg by mouth as needed.      fluticasone  (FLONASE ) 50 MCG/ACT nasal spray Place 2 sprays into both nostrils daily. 16 g 5   cyclobenzaprine  (FLEXERIL ) 10 MG tablet Take 1 tablet (10 mg total) by mouth 3 (three) times daily. 15 tablet 0   traZODone  (DESYREL ) 100 MG tablet Take 1 tablet (100 mg total) by mouth at bedtime. 90 tablet 1   No current facility-administered medications on file prior to visit.    BP 120/70   Pulse (!) 107   Temp 98.2 F (36.8 C) (Oral)   Ht 6' 1 (1.854 m)   Wt 210 lb (95.3 kg)   SpO2 97%   BMI 27.71 kg/m       Objective:   Physical Exam Vitals and nursing note reviewed.  Constitutional:      Appearance: Normal appearance.     Comments: Appears tired  Cardiovascular:     Rate and Rhythm: Normal rate and regular rhythm.     Pulses: Normal pulses.     Heart sounds: Normal heart sounds.  Pulmonary:     Effort: Pulmonary effort is normal.     Breath sounds: Normal breath sounds.  Abdominal:     General: Abdomen is flat. Bowel sounds are normal.     Palpations: Abdomen is soft.  Musculoskeletal:        General: Normal range of motion.  Skin:    General: Skin is warm and dry.     Capillary Refill: Capillary refill takes less than 2 seconds.     Findings: Rash present.         Comments: Small, flat red rash noted on right forearm   Neurological:     General: No focal deficit present.     Mental Status: He is alert and oriented to person, place, and time.  Psychiatric:        Mood and Affect: Mood normal.        Behavior: Behavior normal.        Thought Content: Thought content normal.        Judgment: Judgment normal.       Assessment & Plan:  1. Other fatigue (Primary)  EPPWORTH Sleepiness Scale  = 14 - Will check lab work and refer to Pulmonary for further evaluation of likely sleep apnea  - Ambulatory referral to Pulmonology - CBC;  Future - TSH;  Future - VITAMIN D  25 Hydroxy (Vit-D Deficiency, Fractures); Future - Vitamin B12; Future - Vitamin B12 - VITAMIN D  25 Hydroxy (Vit-D Deficiency, Fractures) - TSH - CBC  2. Rash  - triamcinolone  ointment (KENALOG ) 0.5 %; Apply 1 Application topically 2 (two) times daily.  Dispense: 30 g; Refill: 0  3. Excessive hunger - Likely stress induced. Will check labs to r/o other cause such as hypoglycemia or hyperthyroidism  - CBC; Future - TSH; Future - Comprehensive metabolic panel with GFR; Future - Comprehensive metabolic panel with GFR - TSH - CBC  Darleene Shape, NP  I personally spent a total of in the care of the patient today including preparing to see the patient, getting/reviewing separately obtained history, performing a medically appropriate exam/evaluation, counseling and educating, placing orders, documenting clinical information in the EHR, and communicating results.

## 2024-04-09 ENCOUNTER — Ambulatory Visit: Payer: Self-pay | Admitting: Adult Health

## 2024-04-09 LAB — VITAMIN D 25 HYDROXY (VIT D DEFICIENCY, FRACTURES): VITD: 25.82 ng/mL — ABNORMAL LOW (ref 30.00–100.00)

## 2024-04-09 LAB — COMPREHENSIVE METABOLIC PANEL WITH GFR
ALT: 33 U/L (ref 0–53)
AST: 21 U/L (ref 0–37)
Albumin: 4.5 g/dL (ref 3.5–5.2)
Alkaline Phosphatase: 55 U/L (ref 39–117)
BUN: 15 mg/dL (ref 6–23)
CO2: 27 meq/L (ref 19–32)
Calcium: 9.4 mg/dL (ref 8.4–10.5)
Chloride: 102 meq/L (ref 96–112)
Creatinine, Ser: 1.2 mg/dL (ref 0.40–1.50)
GFR: 69.07 mL/min (ref >60.00)
Glucose, Bld: 129 mg/dL — ABNORMAL HIGH (ref 70–99)
Potassium: 4 meq/L (ref 3.5–5.1)
Sodium: 141 meq/L (ref 135–145)
Total Bilirubin: 0.3 mg/dL (ref 0.2–1.2)
Total Protein: 7.1 g/dL (ref 6.0–8.3)

## 2024-04-09 LAB — VITAMIN B12: Vitamin B-12: 452 pg/mL (ref 211–911)

## 2024-04-09 LAB — CBC
HCT: 47.5 % (ref 39.0–52.0)
Hemoglobin: 15.7 g/dL (ref 13.0–17.0)
MCHC: 33 g/dL (ref 30.0–36.0)
MCV: 89.6 fl (ref 78.0–100.0)
Platelets: 423 K/uL — ABNORMAL HIGH (ref 150.0–400.0)
RBC: 5.3 Mil/uL (ref 4.22–5.81)
RDW: 13.4 % (ref 11.5–15.5)
WBC: 5.8 K/uL (ref 4.0–10.5)

## 2024-04-09 LAB — TSH: TSH: 0.65 u[IU]/mL (ref 0.35–5.50)

## 2024-04-13 NOTE — Telephone Encounter (Signed)
**Note De-identified  [place] Obfuscation** Please advise 

## 2024-04-15 LAB — COLOGUARD: COLOGUARD: NEGATIVE

## 2024-04-29 ENCOUNTER — Ambulatory Visit (INDEPENDENT_AMBULATORY_CARE_PROVIDER_SITE_OTHER): Admitting: Internal Medicine

## 2024-04-29 ENCOUNTER — Encounter: Payer: Self-pay | Admitting: Internal Medicine

## 2024-04-29 VITALS — BP 128/80 | HR 96 | Temp 97.9°F | Ht 72.0 in | Wt 209.0 lb

## 2024-04-29 DIAGNOSIS — G4733 Obstructive sleep apnea (adult) (pediatric): Secondary | ICD-10-CM

## 2024-04-29 DIAGNOSIS — J4520 Mild intermittent asthma, uncomplicated: Secondary | ICD-10-CM

## 2024-04-29 DIAGNOSIS — D869 Sarcoidosis, unspecified: Secondary | ICD-10-CM

## 2024-04-29 NOTE — Progress Notes (Signed)
 Name: Kevin Mcgee MRN: [id number] DOB: 1971/05/08    CHIEF COMPLAINT:  ASSESSMENT OF SLEEP APNEA Assessment of sarcoid Assessment of asthma   HISTORY OF PRESENT ILLNESS: Patient is seen today for problems and issues with sleep related to excessive daytime sleepiness Patient  has been having sleep problems for many years Patient has been having excessive daytime sleepiness for a long time Patient has been having extreme fatigue and tiredness, lack of energy +  snoring every night  Discussed sleep data and reviewed with patient.  Encouraged proper weight management.  Discussed driving precautions and its relationship with hypersomnolence.  Discussed operating dangerous equipment and its relationship with hypersomnolence.  Discussed sleep hygiene, and benefits of a fixed sleep waked time.  The importance of getting eight or more hours of sleep discussed with patient.  Discussed limiting the use of the computer and television before bedtime.  Decrease naps during the day, so night time sleep will become enhanced.  Limit caffeine, and sleep deprivation.  HTN, stroke, and heart failure are potential risk factors.       04/29/2024    9:00 AM  Results of the Epworth flowsheet  Sitting and reading 3  Watching TV 2  Sitting, inactive in a public place (e.g. a theatre or a meeting) 3  As a passenger in a car for an hour without a break 0  Lying down to rest in the afternoon when circumstances permit 2  Sitting and talking to someone 0  Sitting quietly after a lunch without alcohol 2  In a car, while stopped for a few minutes in traffic 1  Total score 13        Assessment of ASTHMA Patient with no significant issues at this time No exacerbation at this time No evidence of heart failure at this time No evidence or signs of infection at this time No respiratory distress No fevers, chills, nausea, vomiting, diarrhea No evidence of lower extremity edema No evidence  hemoptysis Patient rarely uses albuterol  Uses antihistamines as needed Patient is not on any maintenance therapy    ASSESSMENT OF SARCOIDOSIS Patient states he had a tissue diagnosis in 1990 Patient has stated that he is symptoms have been stabilized since early 2000 Patient has no significant respiratory compromise at this time Patient does not have any cardiac issues at this time   PAST MEDICAL HISTORY :   has a past medical history of Allergy, Asthma, and Sarcoidosis.  has a past surgical history that includes Vasectomy. Prior to Admission medications   Medication Sig Start Date End Date Taking? Authorizing Provider  albuterol  (VENTOLIN  HFA) 108 (90 Base) MCG/ACT inhaler Inhale 2 puffs into the lungs every 6 (six) hours as needed for wheezing or shortness of breath. 09/24/22   Nafziger, Darleene, NP  cetirizine (ZYRTEC) 10 MG tablet Take 10 mg by mouth as needed.     [provider]  fluticasone  (FLONASE ) 50 MCG/ACT nasal spray Place 2 sprays into both nostrils daily. 01/06/18   Nafziger, Darleene, NP  triamcinolone  ointment (KENALOG ) 0.5 % Apply 1 Application topically 2 (two) times daily. 04/08/24   Nafziger, Darleene, NP   No Known Allergies  FAMILY HISTORY:  family history includes Hypertension in an other family member. SOCIAL HISTORY:  reports that he quit smoking about 17 years ago. His smoking use included cigarettes. He started smoking about 35 years ago. He has a 4.5 pack-year smoking history. He has never used smokeless tobacco. He reports current alcohol use. He reports  that he does not use drugs.   BP 128/80   Pulse 96   Temp 97.9 F (36.6 C)   Ht 6' (1.829 m)   Wt 209 lb (94.8 kg)   SpO2 98%   BMI 28.35 kg/m      Review of Systems: Gen:  Denies  fever, sweats, chills weight loss  HEENT: Denies blurred vision, double vision, ear pain, eye pain, hearing loss, nose bleeds, sore throat Cardiac:  No dizziness, chest pain or heaviness, chest tightness,edema, No  JVD Resp:   No cough, -sputum production, -shortness of breath,-wheezing, -hemoptysis,  Other:  All other systems negative   Physical Examination:   General Appearance: No distress  EYES PERRLA, EOM intact.   NECK Supple, No JVD Pulmonary: normal breath sounds, No wheezing.  CardiovascularNormal S1,S2.  No m/r/g.   Abdomen: Benign, Soft, non-tender. Neurology UE/LE 5/5 strength, no focal deficits Ext pulses intact, cap refill intact ALL OTHER ROS ARE NEGATIVE       53 year old male seen today for multiple medical issues including history of asthma, history of sarcoidosis, underlying sinus symptoms of sleep apnea Assessment & Plan OSA (obstructive sleep apnea) Recommend obtaining home sleep study to assess for sleep apnea  Mild intermittent reactive airway disease without complication No indication for maintenance therapy at this time  no exacerbation at this time No evidence of heart failure at this time No evidence or signs of infection at this time No respiratory distress No fevers, chills, nausea, vomiting, diarrhea No evidence of lower extremity edema No evidence hemoptysis Avoid Allergens and Irritants Avoid secondhand smoke Avoid SICK contacts Recommend  Masking  when appropriate Recommend Keep up-to-date with vaccinations  SARCOIDOSIS, PULMONARY Chest x-ray reviewed from 2022 Repeat chest x-ray recommended-patient deferred at this time No significant decline in respiratory status Will obtain pulmonary function test-patient deferred at this time   MEDICATION ADJUSTMENTS/LABS AND TESTS ORDERED: Home sleep test to assess for sleep apnea Avoid Allergens and Irritants Avoid secondhand smoke Avoid SICK contacts Recommend  Masking  when appropriate Recommend Keep up-to-date with vaccinations   CURRENT MEDICATIONS REVIEWED AT LENGTH WITH PATIENT TODAY   Patient  satisfied with Plan of action and management. All questions answered   Follow up 4 weeks     I spent a total of 65 minutes dedicated to the care of this patient on the date of this encounter to include pre-visit review of records, face-to-face time with the patient discussing conditions above, post visit ordering of testing, clinical documentation with the electronic health record, making appropriate referrals as documented, and communicating necessary information to the patient's healthcare team.     Nickolas Alm Cellar, M.D.  [facility]  Medical Director [facility] Medical Director [facility] Cardio-Pulmonary Department

## 2024-04-29 NOTE — Patient Instructions (Addendum)
    Recommend Home Sleep Study to assess for sleep apnea Continue to use albuterol  as needed Use antihistamines as needed

## 2024-04-29 NOTE — Assessment & Plan Note (Addendum)
 Chest x-ray reviewed from 2022 Repeat chest x-ray recommended-patient deferred at this time No significant decline in respiratory status Will obtain pulmonary function test-patient deferred at this time

## 2024-05-11 ENCOUNTER — Encounter

## 2024-05-11 DIAGNOSIS — G4733 Obstructive sleep apnea (adult) (pediatric): Secondary | ICD-10-CM

## 2024-05-13 ENCOUNTER — Ambulatory Visit: Payer: Self-pay | Admitting: *Deleted

## 2024-05-13 NOTE — Telephone Encounter (Signed)
 FYI Only or Action Required?: FYI only for provider. Norma in [place] office asked that this pt be triaged.   Patient was last seen in primary care on 04/08/2024 by Merna Huxley, NP.  Called Nurse Triage reporting Blood Sugar Problem. Pt mentioned elevated glucose levels in appt notes.   It was from his lab work done a month ago during visit with Huxley Merna, NP.   Had a sleep study done.   Has f/u appt with [facility] 05/14/2024.  Not instructed to check glucose levels, is not diabetic.   Huxley thinks my elevated blood glucose could be from my sleep issues so that's why the sleep study was ordered.   It's been done.  Symptoms began several months ago. No symptoms.   He also mentioned he has a lump on the back of his neck at the base of his skull and top of his spine.   Huxley will be checking that during OV too on 05/14/2024.   Interventions attempted: Nothing.  Symptoms are: stable.  Triage Disposition: See PCP When Office is Open (Within 3 Days)  Patient/caregiver understands and will follow disposition?: Yes

## 2024-05-13 NOTE — Telephone Encounter (Signed)
 Copied from CRM [id number]. Topic: Clinical - Red Word Triage >> May 13, 2024  9:57 AM Pinkey ORN wrote: Red Word that prompted transfer to Nurse Triage: Swelling >> May 13, 2024  9:59 AM Pinkey ORN wrote: Patient states in the center of his spine it's a bit swollen, states that it doesn't hurt or anything. Patient also mentioned that about a month ago his blood sugar was extremely high Reason for Disposition . [1] Small swelling or lump AND [2] unexplained AND [3] persists > 1 week  Answer Assessment - Initial Assessment Questions 1. BLOOD GLUCOSE: What is your blood glucose level?      Pt was transferred to me to triage.   He has an appt with Darleene Shape, NP for 05/14/2024 at 11:00 (tomorrow).   In his appt notes he listed blood glucose concerns and a lump on his neck.   Madeline with [employer] asked that this pt be triaged. After getting pt transferred to me by our Patient Access Specialist I asked him some questions.   (The Patient Access Specialist had also gotten a lot of information too regarding why this pt needed to be triaged per Madeline in the office).   I have an appt scheduled for tomorrow with Darleene Shape.    Madeline in the office wanted me to talk with a triage nurse.    I just had a sleep study done.  Darleene thinks the elevated glucose on my lab work from my last visit about a month ago could be related to my sleep issues so he ordered the sleep study test which I had done.    I was not instructed to check my glucose or anything.   I asked if pt had diabetes and he said,  No.   My glucose was just elevated on my last blood work.    I do have a lump on the back of my neck where the top of my spine meets my skull at the base.   It's not causing me any headaches or pain.   It's just there and Darleene is going to look at it during my visit tomorrow.  He denies it draining fluid or anything.  It's just there on the back of my neck.     No further triage needed since blood  glucose issue already being addressed by Darleene and was a one time finding on his blood work during last office visit.   Pt will follow up with [facility] on 05/14/2024.   2. ONSET: When did you check the blood glucose?      3. USUAL RANGE: What is your glucose level usually? (e.g., usual fasting morning value, usual evening value)      4. KETONES: Do you check for ketones (urine or blood test strips)? If Yes, ask: What does the test show now?       5. TYPE 1 or 2:  Do you know what type of diabetes you have?  (e.g., Type 1, Type 2, Gestational; doesn't know)       6. INSULIN: Do you take insulin? What type of insulin(s) do you use? What is the mode of delivery? (syringe, pen; injection or pump)?       7. DIABETES PILLS: Do you take any pills for your diabetes? If Yes, ask: Have you missed taking any pills recently?      8. OTHER SYMPTOMS: Do you have any symptoms? (e.g., fever, frequent urination, difficulty breathing, dizziness, weakness, vomiting)  9. PREGNANCY: Is there any chance you are pregnant? When was your last menstrual period?  Answer Assessment - Initial Assessment Questions 1. APPEARANCE of SWELLING: What does it look like?     Lump at the top of his spine and base of his skull that has been there for a while.   Cory to check it during my visit tomorrow (05/14/2024). 2. SIZE: How large is the swelling? (inches, cm or compare to coins)    Not asked 3. LOCATION: Where is the swelling located?     Back of my neck 4. ONSET: When did the swelling start?     It's been there for a while 5. PAIN: Is it painful? If so, ask: How much?     Denies it causing headaches or pain 6. ITCH: Does it itch? If so, ask: How much?     Not asked 7. CAUSE: What do you think caused the swelling?     Don't know 8. NODE: Does it feel like a lymph node? (Note: nodes have a boundary or edge and are movable, unlike most insect bites)     Not  asked  Protocols used: Diabetes - High Blood Sugar-A-AH, Skin - Lump or Localized Swelling-P-AH

## 2024-05-14 ENCOUNTER — Encounter: Payer: Self-pay | Admitting: Adult Health

## 2024-05-14 ENCOUNTER — Ambulatory Visit: Admitting: Adult Health

## 2024-05-14 VITALS — BP 138/82 | HR 96 | Temp 98.3°F | Ht 72.0 in | Wt 210.0 lb

## 2024-05-14 DIAGNOSIS — R739 Hyperglycemia, unspecified: Secondary | ICD-10-CM | POA: Diagnosis not present

## 2024-05-14 DIAGNOSIS — D171 Benign lipomatous neoplasm of skin and subcutaneous tissue of trunk: Secondary | ICD-10-CM | POA: Diagnosis not present

## 2024-05-14 LAB — POCT GLYCOSYLATED HEMOGLOBIN (HGB A1C): Hemoglobin A1C: 6.1 % — AB (ref 4.0–5.6)

## 2024-05-14 NOTE — Progress Notes (Signed)
 Subjective:    Patient ID: Kevin Mcgee, male    DOB: [birth year], 53 y.o.   MRN: [id number]  Diabetes   53 year old male who  has a past medical history of Allergy, Asthma, and Sarcoidosis.  He presents to the office today for follow up.   Last month when he was worked up for fatigue his non fasting glucose was slightly elevated at 129. He was concerned and has started walking for thirty minutes a day and has cut back on sugars. He wants to know if he is diabetic.   He has has a  lump in the middle of his back that has been there  forever and small lump on his neck that he is concerned about. He does not have any pain.    Review of Systems See HPI   Past Medical History:  Diagnosis Date   Allergy    Asthma    Sarcoidosis     Social History   Socioeconomic History   Marital status: Married    Spouse name: Not on file   Number of children: Not on file   Years of education: Not on file   Highest education level: Master's degree (e.g., MA, MS, MEng, MEd, MSW, MBA)  Occupational History   Not on file  Tobacco Use   Smoking status: Former    Current packs/day: 0.00    Average packs/day: 0.3 packs/day for 18.0 years (4.5 ttl pk-yrs)    Types: Cigarettes    Start date: 43    Quit date: 2008    Years since quitting: 17.7   Smokeless tobacco: Never  Substance and Sexual Activity   Alcohol use: Yes    Alcohol/week: 0.0 standard drinks of alcohol   Drug use: No   Sexual activity: Not on file  Other Topics Concern   Not on file  Social History Narrative   Not on file   Social Drivers of Health   Financial Resource Strain: Low Risk  (08/07/2021)   Overall Financial Resource Strain (CARDIA)    Difficulty of Paying Living Expenses: Not hard at all  Food Insecurity: No Food Insecurity (08/07/2021)   Hunger Vital Sign    Worried About Running Out of Food in the Last Year: Never true    Ran Out of Food in the Last Year: Never true  Transportation Needs: No  Transportation Needs (08/07/2021)   PRAPARE - Administrator, Civil Service (Medical): No    Lack of Transportation (Non-Medical): No  Physical Activity: Insufficiently Active (08/07/2021)   Exercise Vital Sign    Days of Exercise per Week: 2 days    Minutes of Exercise per Session: 60 min  Stress: Stress Concern Present (08/07/2021)   [employer] of Occupational Health - Occupational Stress Questionnaire    Feeling of Stress : Very much  Social Connections: Moderately Isolated (08/07/2021)   Social Connection and Isolation Panel    Frequency of Communication with Friends and Family: Three times a week    Frequency of Social Gatherings with Friends and Family: Never    Attends Religious Services: Never    Database administrator or Organizations: No    Attends Engineer, structural: Not on file    Marital Status: Married  Catering manager Violence: Not on file    Past Surgical History:  Procedure Laterality Date   VASECTOMY      Family History  Problem Relation Age of Onset   Hypertension Other  Allergies  Allergen Reactions   Pollen Extract Cough    Rhinitis and eye irritation.    Current Outpatient Medications on File Prior to Visit  Medication Sig Dispense Refill   albuterol  (VENTOLIN  HFA) 108 (90 Base) MCG/ACT inhaler Inhale 2 puffs into the lungs every 6 (six) hours as needed for wheezing or shortness of breath. 8 g 0   cetirizine (ZYRTEC) 10 MG tablet Take 10 mg by mouth as needed.      fluticasone  (FLONASE ) 50 MCG/ACT nasal spray Place 2 sprays into both nostrils daily. 16 g 5   triamcinolone  ointment (KENALOG ) 0.5 % Apply 1 Application topically 2 (two) times daily. 30 g 0   No current facility-administered medications on file prior to visit.    BP 138/82   Pulse 96   Temp 98.3 F (36.8 C) (Oral)   Ht 6' (1.829 m)   Wt 210 lb (95.3 kg)   SpO2 97%   BMI 28.48 kg/m       Objective:   Physical Exam Vitals and nursing note  reviewed.  Constitutional:      Appearance: Normal appearance.  Cardiovascular:     Rate and Rhythm: Normal rate and regular rhythm.     Pulses: Normal pulses.     Heart sounds: Normal heart sounds.  Pulmonary:     Effort: Pulmonary effort is normal.     Breath sounds: Normal breath sounds.  Musculoskeletal:        General: Normal range of motion.  Skin:    General: Skin is warm and dry.     Capillary Refill: Capillary refill takes less than 2 seconds.      Neurological:     General: No focal deficit present.     Mental Status: He is alert and oriented to person, place, and time.  Psychiatric:        Mood and Affect: Mood normal.        Behavior: Behavior normal.        Thought Content: Thought content normal.        Judgment: Judgment normal.       Assessment & Plan:  1. Elevated blood sugar (Primary) - His A1c was 6.1. this keeps him in the prediabetic category. Advised to keep walking and eat low carbs low sugar diet. We can follow up in 3-6 months for repeat testing  - POC HgB A1c  2. Lipoma of torso - Advised that lumps are fatty deposits called lipoma. These are benign. We will hold off on surgical removal for the time being   I personally spent a total of 33 minutes in the care of the patient today including preparing to see the patient, getting/reviewing separately obtained history, performing a medically appropriate exam/evaluation, counseling and educating, placing orders, and documenting clinical information in the EHR.

## 2024-05-14 NOTE — Patient Instructions (Signed)
 Health Maintenance Due  Topic Date Due   Pneumococcal Vaccine: 50+ Years (1 of 2 - PCV) Never done   Hepatitis B Vaccines 19-59 Average Risk (1 of 3 - 19+ 3-dose series) Never done   Zoster Vaccines- Shingrix (1 of 2) Never done   COVID-19 Vaccine (3 - Pfizer risk series) 12/31/2019   Influenza Vaccine  04/02/2024       09/24/2022    3:37 PM 06/04/2022    3:06 PM 10/05/2021    2:43 PM  Depression screen PHQ 2/9  Decreased Interest 0 1 1  Down, Depressed, Hopeless 0 0 1  PHQ - 2 Score 0 1 2  Altered sleeping 0 2 2  Tired, decreased energy 1 1 3   Change in appetite 0 0   Feeling bad or failure about yourself  0 0 0  Trouble concentrating 0 0 1  Moving slowly or fidgety/restless 0 0 0  Suicidal thoughts 0 0 0  PHQ-9 Score 1 4 8   Difficult doing work/chores Not difficult at all Not difficult at all Not difficult at all

## 2024-05-20 DIAGNOSIS — G4733 Obstructive sleep apnea (adult) (pediatric): Secondary | ICD-10-CM | POA: Diagnosis not present

## 2024-05-21 ENCOUNTER — Ambulatory Visit: Payer: Self-pay

## 2024-05-21 DIAGNOSIS — G4733 Obstructive sleep apnea (adult) (pediatric): Secondary | ICD-10-CM

## 2024-05-26 ENCOUNTER — Ambulatory Visit: Admitting: Internal Medicine

## 2024-05-27 ENCOUNTER — Ambulatory Visit: Admitting: Internal Medicine

## 2024-05-27 ENCOUNTER — Encounter: Payer: Self-pay | Admitting: Internal Medicine

## 2024-05-27 VITALS — BP 100/70 | HR 83 | Temp 98.4°F | Ht 74.0 in | Wt 209.2 lb

## 2024-05-27 DIAGNOSIS — J4520 Mild intermittent asthma, uncomplicated: Secondary | ICD-10-CM | POA: Diagnosis not present

## 2024-05-27 DIAGNOSIS — D869 Sarcoidosis, unspecified: Secondary | ICD-10-CM | POA: Diagnosis not present

## 2024-05-27 DIAGNOSIS — G4733 Obstructive sleep apnea (adult) (pediatric): Secondary | ICD-10-CM

## 2024-05-27 NOTE — Progress Notes (Signed)
 Name: Kevin Mcgee MRN: [id number] DOB: 1971-02-05    CHIEF COMPLAINT:  ASSESSMENT OF SLEEP APNEA HST shows moderate OSA 11-16 Assessment of asthma  assessment of sarcoid   HISTORY OF PRESENT ILLNESS: Moderate OSA AHI 11 at 16 significant desaturations Started on CPAP therapy   Assessment of ASTHMA Patient with no significant issues at this time No exacerbation at this time No evidence of heart failure at this time No evidence or signs of infection at this time No respiratory distress No fevers, chills, nausea, vomiting, diarrhea No evidence of lower extremity edema No evidence hemoptysis Patient rarely uses albuterol  Uses antihistamines as needed Patient is not on any maintenance therapy    ASSESSMENT OF SARCOIDOSIS Patient states he had a tissue diagnosis in 1990 Patient has stated that he is symptoms have been stabilized since early 2000 Patient has no significant respiratory compromise at this time Patient does not have any cardiac issues at this time   PAST MEDICAL HISTORY :   has a past medical history of Allergy, Asthma, and Sarcoidosis.  has a past surgical history that includes Vasectomy. Prior to Admission medications   Medication Sig Start Date End Date Taking? Authorizing Provider  albuterol  (VENTOLIN  HFA) 108 (90 Base) MCG/ACT inhaler Inhale 2 puffs into the lungs every 6 (six) hours as needed for wheezing or shortness of breath. 09/24/22   Nafziger, Darleene, NP  cetirizine (ZYRTEC) 10 MG tablet Take 10 mg by mouth as needed.     [provider]  fluticasone  (FLONASE ) 50 MCG/ACT nasal spray Place 2 sprays into both nostrils daily. 01/06/18   Nafziger, Darleene, NP  triamcinolone  ointment (KENALOG ) 0.5 % Apply 1 Application topically 2 (two) times daily. 04/08/24   Merna Darleene, NP   Allergies  Allergen Reactions   Pollen Extract Cough    Rhinitis and eye irritation.    FAMILY HISTORY:  family history includes Hypertension in an other family  member. SOCIAL HISTORY:  reports that he quit smoking about 17 years ago. His smoking use included cigarettes. He started smoking about 35 years ago. He has a 4.5 pack-year smoking history. He has never used smokeless tobacco. He reports current alcohol use. He reports that he does not use drugs.  BP 100/70   Pulse 83   Temp 98.4 F (36.9 C)   Ht 6' 2 (1.88 m)   Wt 209 lb 3.2 oz (94.9 kg)   SpO2 98%   BMI 26.86 kg/m       Review of Systems: Gen:  Denies  fever, sweats, chills weight loss  HEENT: Denies blurred vision, double vision, ear pain, eye pain, hearing loss, nose bleeds, sore throat Cardiac:  No dizziness, chest pain or heaviness, chest tightness,edema, No JVD Resp:   No cough, -sputum production, -shortness of breath,-wheezing, -hemoptysis,  Other:  All other systems negative   Physical Examination:   General Appearance: No distress  EYES PERRLA, EOM intact.   NECK Supple, No JVD Pulmonary: normal breath sounds, No wheezing.  CardiovascularNormal S1,S2.  No m/r/g.   Abdomen: Benign, Soft, non-tender. Neurology UE/LE 5/5 strength, no focal deficits Ext pulses intact, cap refill intact ALL OTHER ROS ARE NEGATIVE   ASSESSMENT AND PLAN 53 year old male seen today for multiple medical issues including history of asthma, history of sarcoidosis, underlying sinus symptoms of sleep apnea  Seen today for multiple medical issues follow-up with history of asthma history of sarcoid and underlying sleep apnea  Assessment of OSA Diagnosis of moderate OSA significant hypoxia AHI  11-16 Patient to start  CPAP therapy  Mild intermittent reactive airway disease without complication No exacerbation at this time Avoid Allergens and Irritants Avoid secondhand smoke Avoid SICK contacts Recommend  Masking  when appropriate Recommend Keep up-to-date with vaccinations No maintenance therapy at this time Albuterol  as needed  SARCOIDOSIS, PULMONARY Chest x-ray reviewed from  2022 Repeat chest x-ray recommended-patient deferred at this time No significant decline in respiratory status Will obtain pulmonary function test-patient deferred at this time No significant respiratory compromise at this time  MEDICATION ADJUSTMENTS/LABS AND TESTS ORDERED: Plan to start CPAP therapy Avoid Allergens and Irritants Avoid secondhand smoke Avoid SICK contacts Recommend  Masking  when appropriate Recommend Keep up-to-date with vaccinations    CURRENT MEDICATIONS REVIEWED AT LENGTH WITH PATIENT TODAY   Patient  satisfied with Plan of action and management. All questions answered   Follow up 3 months   I spent a total of 41 minutes dedicated to the care of this patient on the date of this encounter to include pre-visit review of records, face-to-face time with the patient discussing conditions above, post visit ordering of testing, clinical documentation with the electronic health record, making appropriate referrals as documented, and communicating necessary information to the patient's healthcare team.    The Patient requires high complexity decision making for assessment and support, frequent evaluation and titration of therapies, application of advanced monitoring technologies and extensive interpretation of multiple databases.  Patient satisfied with Plan of action and management. All questions answered    Nickolas Alm Cellar, M.D.  [facility]  Medical Director [facility] Medical Director [facility] Cardio-Pulmonary Department

## 2024-05-27 NOTE — Patient Instructions (Signed)
 Recommend starting CPAP therapy- already ordered Avoid Allergens and Irritants Avoid secondhand smoke Avoid SICK contacts Recommend  Masking  when appropriate Recommend Keep up-to-date with vaccinations

## 2024-06-22 ENCOUNTER — Telehealth: Payer: Self-pay | Admitting: Internal Medicine

## 2024-06-22 NOTE — Telephone Encounter (Signed)
 Dr. Isaiah placed an order for new cpap setup on 05/21/24 it was sent to [facility]. We received a note from [facility] that the patient has declined cpap setup

## 2024-08-01 ENCOUNTER — Encounter: Payer: Self-pay | Admitting: Adult Health
